# Patient Record
Sex: Female | Born: 1950 | Hispanic: No | Marital: Married | Smoking: Never smoker
Health system: Southern US, Community
[De-identification: ages and names within clinical notes are randomized; demographics above are authoritative.]

## PROBLEM LIST (undated history)

## (undated) DIAGNOSIS — K648 Other hemorrhoids: Secondary | ICD-10-CM

## (undated) DIAGNOSIS — K8020 Calculus of gallbladder without cholecystitis without obstruction: Secondary | ICD-10-CM

## (undated) DIAGNOSIS — E039 Hypothyroidism, unspecified: Secondary | ICD-10-CM

## (undated) DIAGNOSIS — D649 Anemia, unspecified: Secondary | ICD-10-CM

## (undated) DIAGNOSIS — Z85038 Personal history of other malignant neoplasm of large intestine: Secondary | ICD-10-CM

## (undated) DIAGNOSIS — K5790 Diverticulosis of intestine, part unspecified, without perforation or abscess without bleeding: Secondary | ICD-10-CM

## (undated) DIAGNOSIS — I10 Essential (primary) hypertension: Secondary | ICD-10-CM

## (undated) DIAGNOSIS — E119 Type 2 diabetes mellitus without complications: Secondary | ICD-10-CM

## (undated) DIAGNOSIS — K219 Gastro-esophageal reflux disease without esophagitis: Secondary | ICD-10-CM

## (undated) DIAGNOSIS — C189 Malignant neoplasm of colon, unspecified: Secondary | ICD-10-CM

## (undated) HISTORY — DX: Anemia, unspecified: D64.9

## (undated) HISTORY — PX: ILEOSTOMY CLOSURE: SHX1784

## (undated) HISTORY — DX: Diverticulosis of intestine, part unspecified, without perforation or abscess without bleeding: K57.90

## (undated) HISTORY — DX: Personal history of other malignant neoplasm of large intestine: Z85.038

## (undated) HISTORY — DX: Other hemorrhoids: K64.8

## (undated) HISTORY — DX: Calculus of gallbladder without cholecystitis without obstruction: K80.20

---

## 2003-05-29 HISTORY — PX: COLON RESECTION: SHX5231

## 2003-07-23 ENCOUNTER — Other Ambulatory Visit: Payer: Self-pay

## 2004-02-26 ENCOUNTER — Ambulatory Visit: Payer: Self-pay | Admitting: Oncology

## 2004-03-28 ENCOUNTER — Ambulatory Visit: Payer: Self-pay | Admitting: Oncology

## 2004-04-27 ENCOUNTER — Ambulatory Visit: Payer: Self-pay | Admitting: Oncology

## 2004-05-28 ENCOUNTER — Ambulatory Visit: Payer: Self-pay | Admitting: Oncology

## 2004-05-28 DIAGNOSIS — C189 Malignant neoplasm of colon, unspecified: Secondary | ICD-10-CM

## 2004-05-28 HISTORY — DX: Malignant neoplasm of colon, unspecified: C18.9

## 2004-06-28 ENCOUNTER — Ambulatory Visit: Payer: Self-pay | Admitting: Oncology

## 2004-08-10 ENCOUNTER — Ambulatory Visit: Payer: Self-pay | Admitting: Oncology

## 2004-08-26 ENCOUNTER — Ambulatory Visit: Payer: Self-pay | Admitting: Oncology

## 2004-10-02 ENCOUNTER — Ambulatory Visit: Payer: Self-pay | Admitting: Internal Medicine

## 2004-10-05 ENCOUNTER — Ambulatory Visit: Payer: Self-pay | Admitting: Oncology

## 2004-10-26 ENCOUNTER — Ambulatory Visit: Payer: Self-pay | Admitting: Oncology

## 2004-11-25 ENCOUNTER — Ambulatory Visit: Payer: Self-pay | Admitting: Oncology

## 2004-12-26 ENCOUNTER — Ambulatory Visit: Payer: Self-pay | Admitting: Oncology

## 2005-01-17 ENCOUNTER — Ambulatory Visit: Payer: Self-pay | Admitting: Internal Medicine

## 2005-01-26 ENCOUNTER — Ambulatory Visit: Payer: Self-pay | Admitting: Oncology

## 2005-01-31 ENCOUNTER — Ambulatory Visit: Payer: Self-pay | Admitting: Internal Medicine

## 2005-01-31 ENCOUNTER — Encounter (INDEPENDENT_AMBULATORY_CARE_PROVIDER_SITE_OTHER): Payer: Self-pay | Admitting: *Deleted

## 2005-04-25 ENCOUNTER — Ambulatory Visit: Payer: Self-pay | Admitting: Oncology

## 2005-04-27 ENCOUNTER — Ambulatory Visit: Payer: Self-pay | Admitting: Oncology

## 2005-05-04 ENCOUNTER — Ambulatory Visit: Payer: Self-pay | Admitting: Oncology

## 2005-07-11 ENCOUNTER — Ambulatory Visit: Payer: Self-pay | Admitting: Oncology

## 2005-07-26 ENCOUNTER — Ambulatory Visit: Payer: Self-pay | Admitting: Oncology

## 2005-08-26 ENCOUNTER — Ambulatory Visit: Payer: Self-pay | Admitting: Oncology

## 2005-10-18 ENCOUNTER — Ambulatory Visit: Payer: Self-pay | Admitting: Oncology

## 2005-10-20 ENCOUNTER — Ambulatory Visit: Payer: Self-pay | Admitting: Internal Medicine

## 2005-10-26 ENCOUNTER — Ambulatory Visit: Payer: Self-pay | Admitting: Oncology

## 2006-06-12 ENCOUNTER — Ambulatory Visit: Payer: Self-pay | Admitting: Oncology

## 2006-06-28 ENCOUNTER — Ambulatory Visit: Payer: Self-pay | Admitting: Oncology

## 2006-07-09 ENCOUNTER — Ambulatory Visit: Payer: Self-pay | Admitting: Internal Medicine

## 2006-07-17 ENCOUNTER — Encounter (INDEPENDENT_AMBULATORY_CARE_PROVIDER_SITE_OTHER): Payer: Self-pay | Admitting: Specialist

## 2006-07-17 ENCOUNTER — Ambulatory Visit: Payer: Self-pay | Admitting: Internal Medicine

## 2006-11-26 ENCOUNTER — Ambulatory Visit: Payer: Self-pay | Admitting: Oncology

## 2007-04-28 ENCOUNTER — Ambulatory Visit: Payer: Self-pay | Admitting: Internal Medicine

## 2007-05-01 ENCOUNTER — Ambulatory Visit: Payer: Self-pay | Admitting: Oncology

## 2007-05-29 ENCOUNTER — Ambulatory Visit: Payer: Self-pay | Admitting: Internal Medicine

## 2007-05-29 ENCOUNTER — Ambulatory Visit: Payer: Self-pay | Admitting: Oncology

## 2007-10-27 ENCOUNTER — Ambulatory Visit: Payer: Self-pay | Admitting: Internal Medicine

## 2007-12-27 ENCOUNTER — Ambulatory Visit: Payer: Self-pay | Admitting: Internal Medicine

## 2008-01-05 ENCOUNTER — Ambulatory Visit: Payer: Self-pay | Admitting: Internal Medicine

## 2008-01-27 ENCOUNTER — Ambulatory Visit: Payer: Self-pay | Admitting: Internal Medicine

## 2008-02-17 ENCOUNTER — Encounter: Payer: Self-pay | Admitting: Internal Medicine

## 2008-02-26 ENCOUNTER — Ambulatory Visit: Payer: Self-pay | Admitting: Internal Medicine

## 2008-02-27 ENCOUNTER — Ambulatory Visit: Payer: Self-pay | Admitting: Internal Medicine

## 2008-03-17 ENCOUNTER — Encounter: Payer: Self-pay | Admitting: Internal Medicine

## 2008-03-21 ENCOUNTER — Encounter: Payer: Self-pay | Admitting: Internal Medicine

## 2008-03-28 ENCOUNTER — Ambulatory Visit: Payer: Self-pay | Admitting: Internal Medicine

## 2008-03-29 ENCOUNTER — Encounter: Payer: Self-pay | Admitting: Internal Medicine

## 2008-03-30 DIAGNOSIS — Z862 Personal history of diseases of the blood and blood-forming organs and certain disorders involving the immune mechanism: Secondary | ICD-10-CM

## 2008-03-30 DIAGNOSIS — K5730 Diverticulosis of large intestine without perforation or abscess without bleeding: Secondary | ICD-10-CM | POA: Insufficient documentation

## 2008-03-30 DIAGNOSIS — K648 Other hemorrhoids: Secondary | ICD-10-CM | POA: Insufficient documentation

## 2008-03-30 DIAGNOSIS — Z85038 Personal history of other malignant neoplasm of large intestine: Secondary | ICD-10-CM | POA: Insufficient documentation

## 2008-04-05 ENCOUNTER — Ambulatory Visit: Payer: Self-pay | Admitting: Internal Medicine

## 2008-04-06 ENCOUNTER — Encounter: Payer: Self-pay | Admitting: Internal Medicine

## 2008-04-06 ENCOUNTER — Ambulatory Visit: Payer: Self-pay | Admitting: Internal Medicine

## 2008-04-08 ENCOUNTER — Encounter: Payer: Self-pay | Admitting: Internal Medicine

## 2008-09-25 ENCOUNTER — Ambulatory Visit: Payer: Self-pay | Admitting: Internal Medicine

## 2008-10-14 ENCOUNTER — Ambulatory Visit: Payer: Self-pay | Admitting: Internal Medicine

## 2008-10-19 ENCOUNTER — Ambulatory Visit: Payer: Self-pay | Admitting: Unknown Physician Specialty

## 2008-10-26 ENCOUNTER — Ambulatory Visit: Payer: Self-pay | Admitting: Internal Medicine

## 2009-04-27 ENCOUNTER — Ambulatory Visit: Payer: Self-pay | Admitting: Internal Medicine

## 2009-05-05 ENCOUNTER — Ambulatory Visit: Payer: Self-pay | Admitting: Internal Medicine

## 2009-05-28 ENCOUNTER — Ambulatory Visit: Payer: Self-pay | Admitting: Internal Medicine

## 2009-10-26 ENCOUNTER — Ambulatory Visit: Payer: Self-pay | Admitting: Internal Medicine

## 2009-11-11 ENCOUNTER — Ambulatory Visit: Payer: Self-pay | Admitting: Internal Medicine

## 2009-11-25 ENCOUNTER — Ambulatory Visit: Payer: Self-pay | Admitting: Internal Medicine

## 2010-02-16 ENCOUNTER — Encounter: Payer: Self-pay | Admitting: Internal Medicine

## 2010-03-08 ENCOUNTER — Ambulatory Visit: Payer: Self-pay | Admitting: Internal Medicine

## 2010-03-13 ENCOUNTER — Encounter (INDEPENDENT_AMBULATORY_CARE_PROVIDER_SITE_OTHER): Payer: Self-pay | Admitting: *Deleted

## 2010-03-31 ENCOUNTER — Encounter (INDEPENDENT_AMBULATORY_CARE_PROVIDER_SITE_OTHER): Payer: Self-pay | Admitting: *Deleted

## 2010-04-06 ENCOUNTER — Ambulatory Visit: Payer: Self-pay | Admitting: Internal Medicine

## 2010-04-28 ENCOUNTER — Telehealth (INDEPENDENT_AMBULATORY_CARE_PROVIDER_SITE_OTHER): Payer: Self-pay | Admitting: *Deleted

## 2010-05-10 ENCOUNTER — Ambulatory Visit: Payer: Self-pay | Admitting: Internal Medicine

## 2010-06-27 NOTE — Letter (Signed)
Summary: [facility] Instructions  [facility]  [address] [place], [place] [address]   Phone: [phone]  Fax: [phone]       Lorraine Lee    Jun 14, 1950    MRN: [id number]       Procedure Day /Date: Friday 04-28-10     Arrival Time:  7:30 a.m.     Procedure Time: 8:30 a.m.     Location of Procedure:                    _x _  [facility] (4th Floor)   PREPARATION FOR COLONOSCOPY WITH MIRALAX  Starting 5 days prior to your procedure  04-23-10  do not eat nuts, seeds, popcorn, corn, beans, peas,  salads, or any raw vegetables.  Do not take any fiber supplements (e.g. Metamucil, Citrucel, and Benefiber). ____________________________________________________________________________________________________   THE DAY BEFORE YOUR PROCEDURE         DATE:  04-27-10  DAY:  Thursday  1   Drink clear liquids the entire day-NO SOLID FOOD  2   Do not drink anything colored red or purple.  Avoid juices with pulp.  No orange juice.  3   Drink at least 64 oz. (8 glasses) of fluid/clear liquids during the day to prevent dehydration and help the prep work efficiently.  CLEAR LIQUIDS INCLUDE: Water Jello Ice Popsicles Tea (sugar ok, no milk/cream) Powdered fruit flavored drinks Coffee (sugar ok, no milk/cream) Gatorade Juice: apple, white grape, white cranberry  Lemonade Clear bullion, consomm, broth Carbonated beverages (any kind) Strained chicken noodle soup Hard Candy  4   Mix the entire bottle of Miralax with 64 oz. of Gatorade/Powerade in the morning and put in the refrigerator to chill.  5   At 3:00 pm take 2 Dulcolax/Bisacodyl tablets.  6   At 4:30 pm take one Reglan/Metoclopramide tablet.  7  Starting at 5:00 pm drink one 8 oz glass of the Miralax mixture every 15-20 minutes until you have finished drinking the entire 64 oz.  You should finish drinking prep around 7:30 or 8:00 pm.  8   If you are nauseated, you may take the 2nd  Reglan/Metoclopramide tablet at 6:30 pm.        9    At 8:00 pm take 2 more DULCOLAX/Bisacodyl tablets.     THE DAY OF YOUR PROCEDURE      DATE:   04-28-10 DAY:  Friday  You may drink clear liquids until  6:30 a.m.  (2 HOURS BEFORE PROCEDURE).   MEDICATION INSTRUCTIONS  Unless otherwise instructed, you should take regular prescription medications with a small sip of water as early as possible the morning of your procedure.         OTHER INSTRUCTIONS  You will need a responsible adult at least 60 years of age to accompany you and drive you home.   This person must remain in the waiting room during your procedure.  Wear loose fitting clothing that is easily removed.  Leave jewelry and other valuables at home.  However, you may wish to bring a book to read or an iPod/MP3 player to listen to music as you wait for your procedure to start.  Remove all body piercing jewelry and leave at home.  Total time from sign-in until discharge is approximately 2-3 hours.  You should go home directly after your procedure and rest.  You can resume normal activities the day after your procedure.  The day of your procedure you  should not:   Drive   Make legal decisions   Operate machinery   Drink alcohol   Return to work  You will receive specific instructions about eating, activities and medications before you leave.   The above instructions have been reviewed and explained to me by   Clide Cliff, RN______________________    I fully understand and can verbalize these instructions _____________________________ Date _______

## 2010-06-27 NOTE — Letter (Signed)
Summary: Pre Visit Letter Revised  [facility]  [address] [place], [place] [address]   Phone: [phone]  Fax: [phone]        03/13/2010 MRN: [id number] Lorraine Lee [address] [place], [place]  [address]             Procedure Date:  04/28/2010   Welcome to the Gastroenterology Division at [facility].    You are scheduled to see a nurse for your pre-procedure visit on 04/06/2010 at 1:00pm on the 3rd floor at [facility], [address]  We ask that you try to arrive at our office 15 minutes prior to your appointment time to allow for check-in.  Please take a minute to review the attached form.  If you answer "Yes" to one or more of the questions on the first page, we ask that you call the person listed at your earliest opportunity.  If you answer "No" to all of the questions, please complete the rest of the form and bring it to your appointment.    Your nurse visit will consist of discussing your medical and surgical history, your immediate family medical history, and your medications.   If you are unable to list all of your medications on the form, please bring the medication bottles to your appointment and we will list them.  We will need to be aware of both prescribed and over the counter drugs.  We will need to know exact dosage information as well.    Please be prepared to read and sign documents such as consent forms, a financial agreement, and acknowledgement forms.  If necessary, and with your consent, a friend or relative is welcome to sit-in on the nurse visit with you.  Please bring your insurance card so that we may make a copy of it.  If your insurance requires a referral to see a specialist, please bring your referral form from your primary care physician.  No co-pay is required for this nurse visit.     If you cannot keep your appointment, please call [phone] to cancel or reschedule prior to your appointment date.  This  allows [place] the opportunity to schedule an appointment for another patient in need of care.    Thank you for choosing [facility] for your medical needs.  We appreciate the opportunity to care for you.  Please visit [place] at our website  to learn more about our practice.  Sincerely, The Gastroenterology Division

## 2010-06-27 NOTE — Letter (Signed)
Summary: Colonoscopy Letter  [facility]  [address] [place], [place] [address]   Phone: [phone]  Fax: [phone]      February 16, 2010 MRN: [id number]   Lorraine Lee [address] [place], [place]  [address]   Dear Ms. Christiano,   According to your medical record, it is time for you to schedule a Colonoscopy. The American Cancer Society recommends this procedure as a method to detect early colon cancer. Patients with a family history of colon cancer, or a personal history of colon polyps or inflammatory bowel disease are at increased risk.  This letter has beeen generated based on the recommendations made at the time of your procedure. If you feel that in your particular situation this may no longer apply, please contact our office.  Please call our office at [phone] to schedule this appointment or to update your records at your earliest convenience.  Thank you for cooperating with [place] to provide you with the very best care possible.   Sincerely,  Hedwig Morton. Juanda Chance, M.D.  [facility] Gastroenterology Division [phone]

## 2010-06-27 NOTE — Miscellaneous (Signed)
Summary: REC COL...AS.  Clinical Lists Changes  Medications: Added new medication of MIRALAX   POWD (POLYETHYLENE GLYCOL 3350) As directed - Signed Added new medication of REGLAN 10 MG  TABS (METOCLOPRAMIDE HCL) As directed - Signed Added new medication of DULCOLAX 10 MG  SUPP (BISACODYL) As directed - Signed Rx of MIRALAX   POWD (POLYETHYLENE GLYCOL 3350) As directed;  #255 gms x 0;  Signed;  Entered by: Clide Cliff RN;  Authorized by: Hart Carwin MD;  Method used: [facility] to [facility] [phone]*, [address] [place], [place], [place]  [address], Ph: [phone] or [phone], Fax: [phone] Rx of REGLAN 10 MG  TABS (METOCLOPRAMIDE HCL) As directed;  #2 x 0;  Signed;  Entered by: Clide Cliff RN;  Authorized by: Hart Carwin MD;  Method used: Electronically to [facility]. [phone]*, [address], [place], [place], [place]  [address], Ph: [phone] or [phone], Fax: [phone] Rx of DULCOLAX 10 MG  SUPP (BISACODYL) As directed;  #4 x 0;  Signed;  Entered by: Clide Cliff RN;  Authorized by: Hart Carwin MD;  Method used: Electronically to [facility]. [phone]*, [address] [place], [place], [place]  [address], Ph: [phone] or [phone], Fax: [phone] Allergies: Changed allergy or adverse reaction from SULFA to SULFA Changed allergy or adverse reaction from * TETANUS to * TETANUS    Prescriptions: DULCOLAX 10 MG  SUPP (BISACODYL) As directed  #4 x 0   Entered by:   Clide Cliff RN   Authorized by:   Hart Carwin MD   Signed by:   Clide Cliff RN on 04/06/2010   Method used:   Electronically to        [facility] [phone]* (retail)       [address]       [place], [place]  [address]       Ph: [phone] or [phone]       Fax: [phone]   RxID:   [id number] REGLAN 10 MG  TABS (METOCLOPRAMIDE HCL) As directed  #2 x 0   Entered by:   Clide Cliff RN   Authorized by:   Hart Carwin MD  Signed by:   Clide Cliff RN on 04/06/2010   Method used:   Electronically to        [facility]. [phone]* (retail)       [address] [place], [place]  [address]       Ph: [phone] or [phone]       Fax: [phone]   RxID:   [id number] MIRALAX   POWD (POLYETHYLENE GLYCOL 3350) As directed  #255 gms x 0   Entered by:   Clide Cliff RN   Authorized by:   Hart Carwin MD   Signed by:   Clide Cliff RN on 04/06/2010   Method used:   Electronically to        [facility]. [phone]* (retail)       [address] [place], [place]  [address]       Ph: [phone] or [phone]       Fax: [phone]   RxID:   [id number]

## 2010-06-29 NOTE — Progress Notes (Signed)
Summary: Cancellation  Phone Note Outgoing Call   Call placed by: Haynes Bast Call placed to: Patient Summary of Call: French Ana called pt concerning todays COL appt, per husband pt did not do prep because she was sick and because she had just gotten in from traveling yesterday.   Per Dr. Juanda Chance please charge cancellation fee and call pt to reschedule the appt.  Follow-up for Phone Call        [employer], employee of pt, called to schedule an appt for pt for constipation... nothing is available  [phone] Follow-up by: Vallarie Mare,  May 03, 2010 3:45 PM    Additional Follow-up for Phone Call Additional follow up Details #2::    Returned patient's call. Spoke with patient. She wants to reschedule her COLON. Rescheduled for 05/10/10 at 9 AM with 8 AM arrival. Patient has her instructions from the colon that she failed to keep and will follow them. Follow-up by: Jesse Fall RN,  May 03, 2010 4:43 PM   Appended Document: Cancellation Patient BILLED.

## 2010-06-29 NOTE — Procedures (Signed)
Summary: Colonoscopy  Patient: Lorraine Lee Note: All result statuses are Final unless otherwise noted.  Tests: (1) Colonoscopy (COL)   COL Colonoscopy           DONE     [facility]     [address]     [place], [place]  [address]           COLONOSCOPY PROCEDURE REPORT           PATIENT:  Lorraine, Lee  MR#:  [id number]     BIRTHDATE:  04-25-1951, 59 yrs. old  GENDER:  female     ENDOSCOPIST:  Hedwig Morton. Juanda Chance, MD     REF. BY:  Lia Foyer, M.D.     PROCEDURE DATE:  05/10/2010     PROCEDURE:  Colonoscopy 528[address]     ASA CLASS:  Class II     INDICATIONS:  history of colon cancer, constipation, Abdominal     pain T3N0 colon cancer rectosigmoid in 2005,,s/p cemoRx and a 2     stage resection at [facility],     last colon 03/2008,     MEDICATIONS:   Versed 5 mg, Fentanyl 50 mcg           DESCRIPTION OF PROCEDURE:   After the risks benefits and     alternatives of the procedure were thoroughly explained, informed     consent was obtained.  Digital rectal exam was performed and     revealed no rectal masses.   The LB [id number] endoscope     was introduced through the anus and advanced to the cecum, which     was identified by both the appendix and ileocecal valve, without     limitations.  The quality of the prep was good, using MiraLax.     The instrument was then slowly withdrawn as the colon was fully     examined.     <<PROCEDUREIMAGES>>           FINDINGS:  There was evidence of a prior segmental colectomy (see     image7 and image6). sigmoid anastomosis at 12 cm, wide open,     normal granulation tissue,  Mild diverticulosis was found (see     image2 and image1).  Otherwise normal colonoscopy without other     polyps, masses, vascular ectasias, or inflammatory changes (see     image3 and image4).  Internal hemorrhoids were found (see image8).     Retroflexed views in the rectum revealed no abnormalities.    The     scope was then withdrawn from the patient  and the procedure     completed.           COMPLICATIONS:  None     ENDOSCOPIC IMPRESSION:     1) Prior segmental colectomy     2) Mild diverticulosis     3) Otherwise nl colonoscopy WMO     4) Internal hemorrhoids     no evidence for recurrent carcinoma (2005)     RECOMMENDATIONS:     1) high fiber diet     Trial of Probiotics, prunes, stool softener prn constipation     REPEAT EXAM:  In 3 year(s) for.           ______________________________     Hedwig Morton. Juanda Chance, MD           CC:           n.     eSIGNED:  Hedwig Morton. Brodie at 05/10/2010 09:37 AM           Sheldon Silvan, [id number]  Note: An exclamation mark (!) indicates a result that was not dispersed into the flowsheet. Document Creation Date: 05/10/2010 9:38 AM _______________________________________________________________________  (1) Order result status: Final Collection or observation date-time: 05/10/2010 09:24 Requested date-time:  Receipt date-time:  Reported date-time:  Referring Physician:   Ordering Physician: Lina Sar [phone]) Specimen Source:  Source: Launa Grill Order Number: [id number] Lab site:   Appended Document: Colonoscopy    Clinical Lists Changes  Observations: Added new observation of COLONNXTDUE: 04/2013 (05/10/2010 11:01)

## 2010-10-13 NOTE — Assessment & Plan Note (Signed)
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  She had a resection at [facility] by  Dr. Luciano Cutter, and has been followed in [place].  The anterior lower  resection and colorectal anastomosis was done on October 28, 2003.  She has  done quite well, but had developed one episode of rectal bleeding.  Her  last colonoscopy in September 2006 showed a well-healed anastomosis,  mild diverticulosis of the left colon.  She denies currently any ongoing  rectal bleeding or abdominal pain.  She has been somewhat constipated  and having poor evacuation, also some urinary stress incontinence  suggestive of pelvic relaxation.  She initially had a colostomy for  diversion and the colostomy was taken down in January 2006.   PHYSICAL EXAMINATION:  Blood pressure 102/78, pulse 64 and weight 202  pounds.  She was alert, oriented, in no distress.  ABDOMEN:  Soft, nontender, with normoactive bowel sounds.  On anoscopic and rectal exam there was normal perianal area, rectal tone  was normal.  She had small first grade hemorrhoids internally, normal  appearing rectal ampulla stools, Hemoccult-negative.   IMPRESSION:  A 60 year old female, status post sigmoid resection of  rectal carcinoma, status post chemotherapy.  She had a T3 N0 tumor.  I  believe her rectal bleeding was related to small hemorrhoids, but she is  quite anxious about it and therefore we will proceed with colonoscopy  which was initially scheduled for September 2008, but because of the  rectal bleeding we will proceed with colonoscopy now.  She also will  need a CEA level and a yearly CT scan  of the pelvis.  I will have to  find out whether Dr. Deland Pretty will be scheduling the CT scan or if our  office should participate in that followup.  As far as her poor  evacuation is concerned, I suggested over-the-counter stool softeners.  We also instructed her on a high-fiber diet to improve her transit time.     Hedwig Morton. Juanda Chance, MD  Electronically Signed    DMB/MedQ  DD: 07/09/2006  DT: 07/10/2006  Job #: [id number]

## 2011-08-14 ENCOUNTER — Ambulatory Visit: Payer: Self-pay | Admitting: Oncology

## 2011-08-27 ENCOUNTER — Ambulatory Visit: Payer: Self-pay | Admitting: Oncology

## 2012-07-26 ENCOUNTER — Ambulatory Visit: Payer: Self-pay | Admitting: Oncology

## 2012-08-26 ENCOUNTER — Ambulatory Visit: Payer: Self-pay | Admitting: Oncology

## 2012-11-07 ENCOUNTER — Ambulatory Visit: Payer: Self-pay | Admitting: Oncology

## 2012-11-25 ENCOUNTER — Ambulatory Visit: Payer: Self-pay | Admitting: Oncology

## 2013-02-18 ENCOUNTER — Encounter: Payer: Self-pay | Admitting: Internal Medicine

## 2013-03-31 ENCOUNTER — Encounter: Payer: Self-pay | Admitting: Internal Medicine

## 2013-04-17 ENCOUNTER — Ambulatory Visit: Payer: Self-pay | Admitting: Hematology and Oncology

## 2013-04-27 ENCOUNTER — Ambulatory Visit: Payer: Self-pay | Admitting: Hematology and Oncology

## 2013-05-06 ENCOUNTER — Telehealth: Payer: Self-pay | Admitting: Internal Medicine

## 2013-05-06 NOTE — Telephone Encounter (Signed)
Left a message for patient to call me. 

## 2013-05-07 NOTE — Telephone Encounter (Signed)
Unable to reach patient will try again later. Loud ringing noise like a fax machine when called.

## 2013-05-08 ENCOUNTER — Telehealth: Payer: Self-pay | Admitting: *Deleted

## 2013-05-08 ENCOUNTER — Encounter: Payer: Self-pay | Admitting: Internal Medicine

## 2013-05-08 ENCOUNTER — Ambulatory Visit (INDEPENDENT_AMBULATORY_CARE_PROVIDER_SITE_OTHER): Payer: 59 | Admitting: Internal Medicine

## 2013-05-08 VITALS — BP 108/72 | HR 76 | Ht 65.0 in | Wt 207.2 lb

## 2013-05-08 DIAGNOSIS — Z85038 Personal history of other malignant neoplasm of large intestine: Secondary | ICD-10-CM

## 2013-05-08 DIAGNOSIS — R112 Nausea with vomiting, unspecified: Secondary | ICD-10-CM

## 2013-05-08 DIAGNOSIS — R109 Unspecified abdominal pain: Secondary | ICD-10-CM

## 2013-05-08 MED ORDER — OMEPRAZOLE 40 MG PO CPDR: 40.0000 mg | DELAYED_RELEASE_CAPSULE | Freq: Every day | ORAL | Status: DC

## 2013-05-08 MED ORDER — MOVIPREP 100 G PO SOLR: 1.0000 | Freq: Once | ORAL | Status: DC

## 2013-05-08 NOTE — Patient Instructions (Signed)
You have been scheduled for an endoscopy and colonoscopy with propofol. Please follow the written instructions given to you at your visit today. Please pick up your prep at the pharmacy within the next 1-3 days. If you use inhalers (even only as needed), please bring them with you on the day of your procedure. Your physician has requested that you go to www.startemmi.com and enter the access code given to you at your visit today. This web site gives a general overview about your procedure. However, you should still follow specific instructions given to you by our office regarding your preparation for the procedure.  We have sent the following medications to your pharmacy for you to pick up at your convenience: Prilosec 40 mg daily  Your physician has requested that you go to the basement for the following lab work before leaving today: CEA, CMET, CBC, IBC  You have been scheduled for an abdominal ultrasound at [facility] (1st floor of hospital) on _______ at ___________. Please arrive 15 minutes prior to your appointment for registration. Make certain not to have anything to eat or drink 6 hours prior to your appointment. Should you need to reschedule your appointment, please contact radiology at [phone]. This test typically takes about 30 minutes to perform.  CC: Dr Corky Downs

## 2013-05-08 NOTE — Progress Notes (Signed)
Lorraine Lee [birth year] [id number]   History of Present Illness:  62 year old female wife of Dr.Minervini, has been experiencing abdominal pain, indigestion,reflux symptoms and several episodes of vomiting while visiting [place]  for Thanksgiving. She has been having 3-4 bowel movements a day. She denies rectal bleeding or weight loss. 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 Resected at [facility] with 2-stage resection by Dr Luciano Cutter. She had a takedown of colostomy in January 2006. Last colonoscopy was in December 2011 and showed patent anastomosis at 12 cm and  mild diverticulosis of the sigmoid colon. Her last CT scan of the abdomen was in October 2009. There is a positive family history of colon cancer in maternal uncle. She has been followed by an oncologist and [place] , She had blood test within last 2 weeks but the don't have the results  Past Medical History  Diagnosis Date  . Diverticulosis   . Anemia   . Internal hemorrhoids without mention of complication   . Personal history of malignant neoplasm of large intestine     Past Surgical History  Procedure Laterality Date  . Colon resection    . Cesarean section      x4  . Ileostomy closure      Allergies  Allergen Reactions  . Sulfonamide Derivatives     REACTION: rash  . Tetanus Toxoid     REACTION: fever and swelling of site    Review of Systems:   The remainder of the 10 point ROS is negative except as outlined in the H&P  Physical Exam: General Appearance Well developed, in no distress Eyes  Non icteric  HEENT  Non traumatic, normocephalic  Mouth No lesion, tongue papillated, no cheilosis Neck Supple without adenopathy, thyroid not enlarged, no carotid bruits, no JVD Lungs Clear to auscultation bilaterally COR Normal S1, normal S2, regular rhythm, no murmur, quiet precordium Abdomen Soft,  mildly tender along the right costal margin  with normoactive bowel  soundsWell-healed surgical scars. No distention. No palpable mass  Rectal Soft Hemoccult negative stool  Extremities  No pedal edema Skin No lesions Neurological Alert and oriented x 3 Psychological Normal mood and affect  Assessment and Plan:   62 year old female 9 years since 2-stage resection of IIA rectosigmoid carcinoma who had a normal colonoscopy in a December 2011 and is due for recall colonoscopy at this time. She is Hemoccult-negative.Marland Kitchen Her blood tests have been normal in [facility] office  but we will check with her doctor 's office  Nausea vomiting and dyspepsia. We will evaluate her for gallbladder disease because of right upper quadrant discomfort. We will obtain upper abdominal ultrasound. We will start Prilosec at 40 mg daily for gastroesophageal reflux and schedule for upper endoscopy the same day as the colonoscopy. Depending on the results of the sono and EGD she may also need a CT scan  if the CEA is elevated    Lina Sar 05/08/2013

## 2013-05-08 NOTE — Telephone Encounter (Signed)
Spoke with patient and she has had nausea when she eats with some vomiting. Also states she is having lower abdominal pain. Denies constipation and diarrhea. Scheduled with Dr. Brodie today at 3:30 PM  

## 2013-05-08 NOTE — Telephone Encounter (Signed)
Spoke with patient and she has had nausea when she eats with some vomiting. Also states she is having lower abdominal pain. Denies constipation and diarrhea. Scheduled with Dr. Juanda Chance today at 3:30 PM

## 2013-05-12 ENCOUNTER — Telehealth: Payer: Self-pay | Admitting: Internal Medicine

## 2013-05-12 ENCOUNTER — Other Ambulatory Visit: Payer: Self-pay | Admitting: *Deleted

## 2013-05-12 DIAGNOSIS — R112 Nausea with vomiting, unspecified: Secondary | ICD-10-CM

## 2013-05-12 DIAGNOSIS — R1013 Epigastric pain: Secondary | ICD-10-CM

## 2013-05-12 NOTE — Telephone Encounter (Signed)
Spoke with patient and gave her the appointment date, time and instructions.

## 2013-05-12 NOTE — Telephone Encounter (Signed)
Spoke with radiology scheduling at [facility] and scheduled [place] on 05/13/13 at 8:45 AM. NPO after midnight. [place] location. [address] Left a message for patient to call me.

## 2013-05-13 ENCOUNTER — Ambulatory Visit (HOSPITAL_COMMUNITY): Admission: RE | Admit: 2013-05-13 | Payer: 59 | Source: Ambulatory Visit

## 2013-05-13 ENCOUNTER — Ambulatory Visit: Payer: Self-pay | Admitting: Internal Medicine

## 2013-05-15 ENCOUNTER — Other Ambulatory Visit: Payer: Self-pay | Admitting: Internal Medicine

## 2013-05-18 ENCOUNTER — Telehealth: Payer: Self-pay | Admitting: *Deleted

## 2013-05-18 ENCOUNTER — Other Ambulatory Visit: Payer: Self-pay | Admitting: *Deleted

## 2013-05-18 DIAGNOSIS — K769 Liver disease, unspecified: Secondary | ICD-10-CM

## 2013-05-18 NOTE — Telephone Encounter (Signed)
Message copied by Daphine Deutscher on Mon May 18, 2013  8:20 AM ------      Message from: Hart Carwin      Created: Fri May 15, 2013  5:58 PM      Regarding: MRI of the liver       Rene Kocher, I have spoken to Dr Juel Burrow and he wants to go ahead and schedule MRI of the liver to assess liver lesion on ultrasound. Please try to schedule it for Mon or Tuesday because she is having colonoscopy on Wed. He also wants her to add EGD to colon because of reflux and indigestion. I will be in [facility] on Monday. Let me know if any problems. ------

## 2013-05-18 NOTE — Telephone Encounter (Signed)
Spoke with [facility] radiology and scheduled MRI of abdomen on 05/19/13 at 11:15/11:45 AM. No prep. Spoke with patient's husband and gave him appointment date and time.

## 2013-05-19 ENCOUNTER — Other Ambulatory Visit: Payer: Self-pay | Admitting: Internal Medicine

## 2013-05-19 ENCOUNTER — Ambulatory Visit: Payer: Self-pay | Admitting: Internal Medicine

## 2013-05-19 DIAGNOSIS — K769 Liver disease, unspecified: Secondary | ICD-10-CM

## 2013-05-20 ENCOUNTER — Ambulatory Visit (AMBULATORY_SURGERY_CENTER): Payer: 59 | Admitting: Internal Medicine

## 2013-05-20 ENCOUNTER — Encounter: Payer: Self-pay | Admitting: Internal Medicine

## 2013-05-20 VITALS — BP 132/74 | HR 68 | Temp 96.8°F | Resp 17 | Ht 65.0 in | Wt 207.0 lb

## 2013-05-20 DIAGNOSIS — R109 Unspecified abdominal pain: Secondary | ICD-10-CM

## 2013-05-20 DIAGNOSIS — K3189 Other diseases of stomach and duodenum: Secondary | ICD-10-CM

## 2013-05-20 DIAGNOSIS — Z85038 Personal history of other malignant neoplasm of large intestine: Secondary | ICD-10-CM

## 2013-05-20 MED ORDER — SODIUM CHLORIDE 0.9 % IV SOLN: 500.0000 mL | INTRAVENOUS | Status: DC

## 2013-05-20 NOTE — Patient Instructions (Signed)

## 2013-05-20 NOTE — Progress Notes (Signed)
A/ox3 pleased with MAC, report to Kristin RN 

## 2013-05-20 NOTE — Op Note (Signed)
[facility] [address] [place], [address]   ENDOSCOPY PROCEDURE REPORT  PATIENT: Lorraine, Lee  MR#: [id number] BIRTHDATE: 1951/04/03 , 62  yrs. old GENDER: Female ENDOSCOPIST: Hart Carwin, MD REFERRED BY:  Corky Downs, M.D. PROCEDURE DATE:  05/20/2013 PROCEDURE:  EGD, diagnostic ASA CLASS:     Class II INDICATIONS:  Dyspepsia.   some episode of vomiting.  Cholelithiasis on ultrasound, history of colon cancer resected in 2005. MEDICATIONS: MAC sedation, administered by CRNA and propofol (Diprivan) 150mg  IV TOPICAL ANESTHETIC: none  DESCRIPTION OF PROCEDURE: After the risks benefits and alternatives of the procedure were thoroughly explained, informed consent was obtained.  The LB [id number] endoscope was introduced through the mouth and advanced to the second portion of the duodenum. Without limitations.  The instrument was slowly withdrawn as the mucosa was fully examined.      [Esophagus: esophageal mucosa was normal in the proximal mid and distal esophagus. Squamocolumnar junction was unremarkable. There was no significant hiatal hernia Stomach: Gastric folds were normal. Gastric antrum and pyloric outlet were unremarkable. Retroflexion of the endoscope revealed normal fundus and cardiaDuodenum: Duodenal bulb and descending duodenum was normal         The scope was then withdrawn from the patient and the procedure completed.  COMPLICATIONS: There were no complications. ENDOSCOPIC IMPRESSION: normal upper endoscopy of the esophagus stomach and duodenum. Nothing to account for dyspepsia, suspect is traumatic cholelithiasis versus irritable bowel syndrome RECOMMENDATIONS: 1.  Proceed with colonoscopy Consider a HIDA scan Continue acid reducing indications when necessary dyspepsia 2.  Proceed with colonoscopy Consider a HIDA scan Continue acid reducing indications when necessary dyspepsia  REPEAT EXAM: no  eSigned:  Hart Carwin,  MD 05/20/2013 11:24 AM   CC:  PATIENT NAME:  Lorraine, Lee MR#: [id number]

## 2013-05-20 NOTE — Op Note (Signed)
[facility] [address] [place], [address]   COLONOSCOPY PROCEDURE REPORT  PATIENT: Lorraine Lee, Lorraine Lee  MR#: [id number] BIRTHDATE: 11-20-1950 , 62  yrs. old GENDER: Female ENDOSCOPIST: Hart Carwin, MD REFERRED AO:ZHYQM Bulnes, M.D. PROCEDURE DATE:  05/20/2013 PROCEDURE:   Colonoscopy, screening First Screening Colonoscopy - Avg.  risk and is 50 yrs.  old or older - No.  Prior Negative Screening - Now for repeat screening. N/A  History of Adenoma - Now for follow-up colonoscopy & has been > or = to 3 yrs.  N/A  Polyps Removed Today? No.  Recommend repeat exam, <10 yrs? Yes.  High risk (family or personal hx). 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  Ostomy taken down 2006, Last colonoscopy December 2011 showed a widely patent anastomosis and mild diverticulosis., hemorrhoids MEDICATIONS: MAC sedation, administered by CRNA and propofol (Diprivan) 150mg  IV  DESCRIPTION OF PROCEDURE:   After the risks benefits and alternatives of the procedure were thoroughly explained, informed consent was obtained.  A digital rectal exam revealed no abnormalities of the rectum.   The LB [id number]  endoscope was introduced through the anus and advanced to the cecum, which was identified by both the appendix and ileocecal valve. No adverse events experienced.   The quality of the prep was excellent, using MoviPrep  The instrument was then slowly withdrawn as the colon was fully examined.      COLON FINDINGS: There was evidence of a prior end-to-end colo-colonic surgical anastomosis in the sigmoid colon.at the level of 12 cm from the anal canal. Anastomosis was widely patent. There were scattered diverticula proximal to the anastomosis  Retroflexed views revealed no abnormalities.there was small first-grade internal hemorrhoids The time to cecum=6 minutes 01  seconds. Withdrawal time=7 minutes 10 seconds.  The scope was withdrawn and the procedure completed. COMPLICATIONS: There were no complications.  ENDOSCOPIC IMPRESSION: 1There was evidence of a prior colo-colonic surgical anastomosis in the sigmoid colon 2. no evidence for recurrent cancer 3. Mild sigmoid diverticulosis 4 small internal hemorrhoids  RECOMMENDATIONS: high-fiber diet Recall colonoscopy in 5 years   eSigned:  Hart Carwin, MD 05/20/2013 11:32 AM   cc:   PATIENT NAME:  Lorraine Lee, Lorraine Lee MR#: [id number]

## 2013-05-25 ENCOUNTER — Telehealth: Payer: Self-pay | Admitting: *Deleted

## 2013-05-25 NOTE — Telephone Encounter (Signed)
No answer, message left for the patient. 

## 2013-05-26 ENCOUNTER — Ambulatory Visit (HOSPITAL_COMMUNITY): Payer: 59

## 2013-05-28 HISTORY — PX: COLONOSCOPY WITH ESOPHAGOGASTRODUODENOSCOPY (EGD): SHX5779

## 2013-06-12 ENCOUNTER — Encounter: Payer: Self-pay | Admitting: Internal Medicine

## 2013-07-02 ENCOUNTER — Encounter: Payer: Self-pay | Admitting: Internal Medicine

## 2013-12-23 ENCOUNTER — Ambulatory Visit: Payer: Self-pay | Admitting: Hematology and Oncology

## 2014-01-15 ENCOUNTER — Ambulatory Visit: Payer: Self-pay | Admitting: Endocrinology

## 2014-02-23 ENCOUNTER — Ambulatory Visit: Payer: Self-pay | Admitting: Endocrinology

## 2014-02-25 ENCOUNTER — Other Ambulatory Visit: Payer: Self-pay | Admitting: Orthopedic Surgery

## 2014-03-12 ENCOUNTER — Encounter (HOSPITAL_BASED_OUTPATIENT_CLINIC_OR_DEPARTMENT_OTHER): Payer: Self-pay | Admitting: *Deleted

## 2014-03-12 NOTE — Progress Notes (Signed)
New diabetic-will need [id number]-

## 2014-03-16 NOTE — H&P (Signed)
Lorraine Lee is an 63 y.o. female.   Chief Complaint: Right knee pain  HPI: Lorraine Lee presents to our clinic today complaining of right knee pain.  This pain began about 2 months ago after descending 5 flights of stairs in [place].  At that time both lower extremities were extremely swollen; however, this has since resolved.  At that time, she sought medical attention and was prescribed physical therapy and cortisone injections.  The therapy did not alleviate her pain, nor did the 3 cortisone injections.  She states that the cortisone injections only relieved her pain for a couple of hours.  She states the pain is constant when moving and rates at about a 5/10.  It appears to be getting worse, but it does not wake her from sleep.  She continues to wear a brace, but is not sure if it's helping.  She has had previous x-rays, and an MRI.  MRI shows a radial tear through the root of the posterior horn of the medial meniscus.  She was also diagnosed with a Baker's cyst.  She has been prescribed Mobic; however, it has recently been replaced with Indocin.  She has also been prescribed Voltaren gel.  She does have a history of knee problems, but significant for left knee.  Past Medical History  Diagnosis Date  . Diverticulosis   . Anemia   . Internal hemorrhoids without mention of complication   . Personal history of malignant neoplasm of large intestine   . Diabetes mellitus without complication   . GERD (gastroesophageal reflux disease)     Past Surgical History  Procedure Laterality Date  . Colon resection  2005  . Cesarean section      x4  . Ileostomy closure    . Colonoscopy with esophagogastroduodenoscopy (egd)      Family History  Problem Relation Age of Onset  . Colon cancer Maternal Uncle   . Diabetes Father   . Heart disease Father    Social History:  reports that she has never smoked. She has never used smokeless tobacco. She reports that she does not drink alcohol or use illicit  drugs.  Allergies:  Allergies  Allergen Reactions  . Sulfonamide Derivatives     REACTION: rash  . Tetanus Toxoid     REACTION: fever and swelling of site    No prescriptions prior to admission    No results found for this or any previous visit (from the past 48 hour(s)). No results found.  Review of Systems  Constitutional: Negative.   HENT: Negative.   Eyes: Negative.   Respiratory: Negative.   Cardiovascular: Positive for leg swelling.  Gastrointestinal: Negative.   Genitourinary: Negative.   Musculoskeletal: Positive for joint pain and myalgias.  Skin: Negative.   Neurological: Negative.   Psychiatric/Behavioral: Negative.     Height 5\' 5"  (1.651 m), weight 92.987 kg (205 lb). Physical Exam  Constitutional: She is oriented to person, place, and time. She appears well-developed and well-nourished.  HENT:  Head: Normocephalic and atraumatic.  Eyes: Pupils are equal, round, and reactive to light.  Neck: Normal range of motion. Neck supple.  Cardiovascular: Intact distal pulses.   Respiratory: Effort normal.  Musculoskeletal: She exhibits tenderness.  Tender to palpation along the medial joint line.  Mild tenderness in the popliteal fossa.  No tenderness along the lateral joint line, quadriceps or patellar tendons.  No effusion present.  Range of motion of right knee: 0-130.  Strength 5/5.  No ligamentous laxity.  Neurological: She is alert and oriented to person, place, and time.  Skin: Skin is warm and dry.  Psychiatric: She has a normal mood and affect. Her behavior is normal. Judgment and thought content normal.     Assessment/Plan Impression: 1. Medial meniscal tear of right knee 2. Chondromalacia of right knee    Plan: MRI findings were discussed with the patient and she has elected to have a right knee arthroscopy.  The procedure was discussed and all questions were answered.  If this procedure does not improve her pain she will consider synthetic joint  fluid injections.  She is encouraged to do low impact exercises such as biking, walking, and swimming.  Lorraine Lee R 03/16/2014, 12:36 PM

## 2014-03-17 ENCOUNTER — Encounter (HOSPITAL_BASED_OUTPATIENT_CLINIC_OR_DEPARTMENT_OTHER): Payer: 59 | Admitting: Certified Registered"

## 2014-03-17 ENCOUNTER — Encounter (HOSPITAL_BASED_OUTPATIENT_CLINIC_OR_DEPARTMENT_OTHER)
Admission: RE | Disposition: A | Discharge status: Home / Self Care | Payer: Self-pay | Source: Ambulatory Visit | Attending: Orthopedic Surgery

## 2014-03-17 ENCOUNTER — Ambulatory Visit (HOSPITAL_BASED_OUTPATIENT_CLINIC_OR_DEPARTMENT_OTHER)
Admission: RE | Admit: 2014-03-17 | Discharge: 2014-03-17 | Disposition: A | Discharge status: Home / Self Care | Payer: 59 | Source: Ambulatory Visit | Attending: Orthopedic Surgery | Admitting: Orthopedic Surgery

## 2014-03-17 ENCOUNTER — Ambulatory Visit (HOSPITAL_BASED_OUTPATIENT_CLINIC_OR_DEPARTMENT_OTHER): Payer: 59 | Admitting: Certified Registered"

## 2014-03-17 ENCOUNTER — Encounter (HOSPITAL_BASED_OUTPATIENT_CLINIC_OR_DEPARTMENT_OTHER): Payer: Self-pay

## 2014-03-17 DIAGNOSIS — M94261 Chondromalacia, right knee: Secondary | ICD-10-CM | POA: Diagnosis not present

## 2014-03-17 DIAGNOSIS — S83241A Other tear of medial meniscus, current injury, right knee, initial encounter: Secondary | ICD-10-CM | POA: Diagnosis present

## 2014-03-17 DIAGNOSIS — Y9339 Activity, other involving climbing, rappelling and jumping off: Secondary | ICD-10-CM | POA: Diagnosis not present

## 2014-03-17 DIAGNOSIS — Z85038 Personal history of other malignant neoplasm of large intestine: Secondary | ICD-10-CM | POA: Diagnosis not present

## 2014-03-17 DIAGNOSIS — Y998 Other external cause status: Secondary | ICD-10-CM | POA: Diagnosis not present

## 2014-03-17 DIAGNOSIS — S83206A Unspecified tear of unspecified meniscus, current injury, right knee, initial encounter: Secondary | ICD-10-CM

## 2014-03-17 DIAGNOSIS — Z887 Allergy status to serum and vaccine status: Secondary | ICD-10-CM | POA: Insufficient documentation

## 2014-03-17 DIAGNOSIS — Z882 Allergy status to sulfonamides status: Secondary | ICD-10-CM | POA: Diagnosis not present

## 2014-03-17 DIAGNOSIS — E119 Type 2 diabetes mellitus without complications: Secondary | ICD-10-CM | POA: Diagnosis not present

## 2014-03-17 DIAGNOSIS — K219 Gastro-esophageal reflux disease without esophagitis: Secondary | ICD-10-CM | POA: Diagnosis not present

## 2014-03-17 DIAGNOSIS — Y9289 Other specified places as the place of occurrence of the external cause: Secondary | ICD-10-CM | POA: Diagnosis not present

## 2014-03-17 HISTORY — DX: Gastro-esophageal reflux disease without esophagitis: K21.9

## 2014-03-17 HISTORY — DX: Type 2 diabetes mellitus without complications: E11.9

## 2014-03-17 HISTORY — PX: KNEE ARTHROSCOPY WITH MEDIAL MENISECTOMY: SHX5651

## 2014-03-17 LAB — POCT I-STAT, CHEM 8
BUN: 18 mg/dL (ref 6–23)
CALCIUM ION: 1.14 mmol/L (ref 1.13–1.30)
Chloride: 110 mEq/L (ref 96–112)
Creatinine, Ser: 0.8 mg/dL (ref 0.50–1.10)
GLUCOSE: 118 mg/dL — AB (ref 70–99)
HEMATOCRIT: 39 % (ref 36.0–46.0)
Hemoglobin: 13.3 g/dL (ref 12.0–15.0)
Potassium: 4.2 mEq/L (ref 3.7–5.3)
Sodium: 140 mEq/L (ref 137–147)
TCO2: 26 mmol/L (ref 0–100)

## 2014-03-17 LAB — GLUCOSE, CAPILLARY: Glucose-Capillary: 91 mg/dL (ref 70–99)

## 2014-03-17 SURGERY — ARTHROSCOPY, KNEE, WITH MEDIAL MENISCECTOMY
Anesthesia: General | Site: Knee | Laterality: Right

## 2014-03-17 MED ORDER — LACTATED RINGERS IV SOLN
INTRAVENOUS | Status: DC
  Administered 2014-03-17 (×2): via INTRAVENOUS

## 2014-03-17 MED ORDER — DEXAMETHASONE SODIUM PHOSPHATE 4 MG/ML IJ SOLN
INTRAMUSCULAR | Status: DC | PRN
  Administered 2014-03-17: 10 mg via INTRAVENOUS

## 2014-03-17 MED ORDER — OXYCODONE HCL 5 MG/5ML PO SOLN: 5.0000 mg | Freq: Once | ORAL | Status: AC | PRN

## 2014-03-17 MED ORDER — CEFAZOLIN SODIUM-DEXTROSE 2-3 GM-% IV SOLR: 2.0000 g | INTRAVENOUS | Status: DC

## 2014-03-17 MED ORDER — OXYCODONE HCL 5 MG PO TABS: 5.0000 mg | ORAL_TABLET | Freq: Once | ORAL | Status: DC | PRN

## 2014-03-17 MED ORDER — OXYCODONE HCL 5 MG PO TABS
5.0000 mg | ORAL_TABLET | Freq: Once | ORAL | Status: AC | PRN
  Administered 2014-03-17: 5 mg via ORAL

## 2014-03-17 MED ORDER — OXYCODONE HCL 5 MG/5ML PO SOLN: 5.0000 mg | Freq: Once | ORAL | Status: DC | PRN

## 2014-03-17 MED ORDER — MIDAZOLAM HCL 5 MG/5ML IJ SOLN
INTRAMUSCULAR | Status: DC | PRN
  Administered 2014-03-17: 2 mg via INTRAVENOUS

## 2014-03-17 MED ORDER — HYDROMORPHONE HCL 1 MG/ML IJ SOLN: 0.2500 mg | INTRAMUSCULAR | Status: DC | PRN

## 2014-03-17 MED ORDER — HYDROCODONE-ACETAMINOPHEN 5-325 MG PO TABS: 1.0000 | ORAL_TABLET | Freq: Four times a day (QID) | ORAL | Status: DC | PRN

## 2014-03-17 MED ORDER — HYDROMORPHONE HCL 1 MG/ML IJ SOLN
INTRAMUSCULAR | Status: AC
  Filled 2014-03-17: qty 1

## 2014-03-17 MED ORDER — ONDANSETRON HCL 4 MG/2ML IJ SOLN: 4.0000 mg | Freq: Once | INTRAMUSCULAR | Status: DC | PRN

## 2014-03-17 MED ORDER — FENTANYL CITRATE 0.05 MG/ML IJ SOLN
INTRAMUSCULAR | Status: DC | PRN
  Administered 2014-03-17: 25 ug via INTRAVENOUS

## 2014-03-17 MED ORDER — HYDROMORPHONE HCL 1 MG/ML IJ SOLN
0.2500 mg | INTRAMUSCULAR | Status: DC | PRN
  Administered 2014-03-17 (×2): 0.5 mg via INTRAVENOUS

## 2014-03-17 MED ORDER — CEFAZOLIN SODIUM-DEXTROSE 2-3 GM-% IV SOLR
INTRAVENOUS | Status: DC | PRN
  Administered 2014-03-17: 2 g via INTRAVENOUS

## 2014-03-17 MED ORDER — ONDANSETRON HCL 4 MG/2ML IJ SOLN: 4.0000 mg | Freq: Four times a day (QID) | INTRAMUSCULAR | Status: DC | PRN

## 2014-03-17 MED ORDER — OXYCODONE HCL 5 MG PO TABS
ORAL_TABLET | ORAL | Status: AC
  Filled 2014-03-17: qty 1

## 2014-03-17 MED ORDER — FENTANYL CITRATE 0.05 MG/ML IJ SOLN
INTRAMUSCULAR | Status: AC
  Filled 2014-03-17: qty 6

## 2014-03-17 MED ORDER — EPINEPHRINE HCL 1 MG/ML IJ SOLN
INTRAMUSCULAR | Status: DC | PRN
  Administered 2014-03-17: 1 mg

## 2014-03-17 MED ORDER — MIDAZOLAM HCL 2 MG/2ML IJ SOLN: 1.0000 mg | INTRAMUSCULAR | Status: DC | PRN

## 2014-03-17 MED ORDER — PROPOFOL 10 MG/ML IV BOLUS
INTRAVENOUS | Status: DC | PRN
  Administered 2014-03-17: 180 mg via INTRAVENOUS
  Administered 2014-03-17: 20 mg via INTRAVENOUS

## 2014-03-17 MED ORDER — BUPIVACAINE-EPINEPHRINE 0.5% -1:200000 IJ SOLN
INTRAMUSCULAR | Status: DC | PRN
  Administered 2014-03-17: 20 mL

## 2014-03-17 MED ORDER — KCL IN DEXTROSE-NACL 20-5-0.2 MEQ/L-%-% IV SOLN: INTRAVENOUS | Status: DC

## 2014-03-17 MED ORDER — SODIUM CHLORIDE 0.9 % IR SOLN
Status: DC | PRN
  Administered 2014-03-17: 1000 mL

## 2014-03-17 MED ORDER — ONDANSETRON HCL 4 MG/2ML IJ SOLN
INTRAMUSCULAR | Status: DC | PRN
  Administered 2014-03-17: 4 mg via INTRAVENOUS

## 2014-03-17 MED ORDER — BUPIVACAINE HCL (PF) 0.5 % IJ SOLN
INTRAMUSCULAR | Status: AC
  Filled 2014-03-17: qty 30

## 2014-03-17 MED ORDER — MIDAZOLAM HCL 2 MG/2ML IJ SOLN
INTRAMUSCULAR | Status: AC
  Filled 2014-03-17: qty 2

## 2014-03-17 MED ORDER — BUPIVACAINE-EPINEPHRINE (PF) 0.5% -1:200000 IJ SOLN
INTRAMUSCULAR | Status: AC
  Filled 2014-03-17: qty 30

## 2014-03-17 MED ORDER — LIDOCAINE HCL (CARDIAC) 20 MG/ML IV SOLN
INTRAVENOUS | Status: DC | PRN
  Administered 2014-03-17: 50 mg via INTRAVENOUS

## 2014-03-17 MED ORDER — FENTANYL CITRATE 0.05 MG/ML IJ SOLN: 50.0000 ug | INTRAMUSCULAR | Status: DC | PRN

## 2014-03-17 MED ORDER — MEPERIDINE HCL 25 MG/ML IJ SOLN: 6.2500 mg | INTRAMUSCULAR | Status: DC | PRN

## 2014-03-17 MED ORDER — CEFAZOLIN SODIUM-DEXTROSE 2-3 GM-% IV SOLR
INTRAVENOUS | Status: AC
  Filled 2014-03-17: qty 50

## 2014-03-17 SURGICAL SUPPLY — 43 items
BANDAGE ELASTIC 6 VELCRO ST LF (GAUZE/BANDAGES/DRESSINGS) ×3 IMPLANT
BLADE 4.2CUDA (BLADE) IMPLANT
BLADE CUTTER GATOR 3.5 (BLADE) ×2 IMPLANT
BLADE GREAT WHITE 4.2 (BLADE) IMPLANT
BLADE GREAT WHITE 4.2MM (BLADE)
BNDG COHESIVE 6X5 TAN STRL LF (GAUZE/BANDAGES/DRESSINGS) ×3 IMPLANT
CANISTER SUCT 3000ML (MISCELLANEOUS) IMPLANT
DRAPE ARTHROSCOPY W/POUCH 114 (DRAPES) ×3 IMPLANT
DRSG PAD ABDOMINAL 8X10 ST (GAUZE/BANDAGES/DRESSINGS) ×2 IMPLANT
DURAPREP 26ML APPLICATOR (WOUND CARE) ×3 IMPLANT
ELECT MENISCUS 165MM 90D (ELECTRODE) IMPLANT
ELECT REM PT RETURN 9FT ADLT (ELECTROSURGICAL)
ELECTRODE REM PT RTRN 9FT ADLT (ELECTROSURGICAL) IMPLANT
GAUZE SPONGE 4X4 12PLY STRL (GAUZE/BANDAGES/DRESSINGS) ×3 IMPLANT
GAUZE SPONGE 4X4 16PLY XRAY LF (GAUZE/BANDAGES/DRESSINGS) ×2 IMPLANT
GAUZE XEROFORM 1X8 LF (GAUZE/BANDAGES/DRESSINGS) ×3 IMPLANT
GLOVE BIO SURGEON STRL SZ7.5 (GLOVE) ×3 IMPLANT
GLOVE BIO SURGEON STRL SZ8.5 (GLOVE) ×3 IMPLANT
GLOVE BIOGEL PI IND STRL 8 (GLOVE) ×1 IMPLANT
GLOVE BIOGEL PI IND STRL 9 (GLOVE) ×1 IMPLANT
GLOVE BIOGEL PI INDICATOR 8 (GLOVE) ×2
GLOVE BIOGEL PI INDICATOR 9 (GLOVE) ×2
GOWN STRL REUS W/ TWL LRG LVL3 (GOWN DISPOSABLE) ×1 IMPLANT
GOWN STRL REUS W/ TWL XL LVL3 (GOWN DISPOSABLE) ×2 IMPLANT
GOWN STRL REUS W/TWL LRG LVL3 (GOWN DISPOSABLE) ×3
GOWN STRL REUS W/TWL XL LVL3 (GOWN DISPOSABLE) ×6
IV NS IRRIG 3000ML ARTHROMATIC (IV SOLUTION) ×3 IMPLANT
KNEE WRAP E Z 3 GEL PACK (MISCELLANEOUS) ×3 IMPLANT
MANIFOLD NEPTUNE II (INSTRUMENTS) ×2 IMPLANT
NDL SAFETY ECLIPSE 18X1.5 (NEEDLE) ×1 IMPLANT
NEEDLE HYPO 18GX1.5 SHARP (NEEDLE) ×3
PACK ARTHROSCOPY DSU (CUSTOM PROCEDURE TRAY) ×3 IMPLANT
PACK BASIN DAY SURGERY FS (CUSTOM PROCEDURE TRAY) ×3 IMPLANT
PAD ALCOHOL SWAB (MISCELLANEOUS) ×3 IMPLANT
PENCIL BUTTON HOLSTER BLD 10FT (ELECTRODE) IMPLANT
SET ARTHROSCOPY TUBING (MISCELLANEOUS) ×3
SET ARTHROSCOPY TUBING LN (MISCELLANEOUS) ×1 IMPLANT
SLEEVE SCD COMPRESS KNEE MED (MISCELLANEOUS) IMPLANT
SYR 3ML 18GX1 1/2 (SYRINGE) IMPLANT
SYR 5ML LL (SYRINGE) ×3 IMPLANT
TOWEL OR 17X24 6PK STRL BLUE (TOWEL DISPOSABLE) ×3 IMPLANT
WAND STAR VAC 90 (SURGICAL WAND) IMPLANT
WATER STERILE IRR 1000ML POUR (IV SOLUTION) ×3 IMPLANT

## 2014-03-17 NOTE — Anesthesia Preprocedure Evaluation (Addendum)
Anesthesia Evaluation  Patient identified by MRN, date of birth, ID band Patient awake    Reviewed: Allergy & Precautions, H&P , NPO status , Patient's Chart, lab work & pertinent test results  Airway Mallampati: II TM Distance: >3 FB Neck ROM: full    Dental   Pulmonary neg pulmonary ROS,          Cardiovascular     Neuro/Psych    GI/Hepatic GERD-  Medicated and Controlled,  Endo/Other  diabetes, Type 2obese  Renal/GU      Musculoskeletal   Abdominal   Peds  Hematology   Anesthesia Other Findings   Reproductive/Obstetrics                          Anesthesia Physical Anesthesia Plan  ASA: II  Anesthesia Plan: General   Post-op Pain Management:    Induction: Intravenous  Airway Management Planned: LMA  Additional Equipment:   Intra-op Plan:   Post-operative Plan:   Informed Consent: I have reviewed the patients History and Physical, chart, labs and discussed the procedure including the risks, benefits and alternatives for the proposed anesthesia with the patient or authorized representative who has indicated his/her understanding and acceptance.     Plan Discussed with: CRNA and Surgeon  Anesthesia Plan Comments:        Anesthesia Quick Evaluation

## 2014-03-17 NOTE — Transfer of Care (Signed)
Immediate Anesthesia Transfer of Care Note  Patient: Lorraine Lee  Procedure(s) Performed: Procedure(s): RIGHT ARTHROSCOPY KNEE,MEDIAL MENISECTOMY AND DEBRIDEMENT CHONDROMALACIA,  (Right)  Patient Location: PACU  Anesthesia Type:General  Level of Consciousness: awake, alert , oriented and patient cooperative  Airway & Oxygen Therapy: Patient Spontanous Breathing and Patient connected to face mask oxygen  Post-op Assessment: Report given to PACU RN and Post -op Vital signs reviewed and stable  Post vital signs: Reviewed and stable  Complications: No apparent anesthesia complications

## 2014-03-17 NOTE — Anesthesia Procedure Notes (Signed)
Procedure Name: LMA Insertion Date/Time: 03/17/2014 1:06 PM Performed by: Toula Moos L Pre-anesthesia Checklist: Patient identified, Emergency Drugs available, Suction available, Patient being monitored and Timeout performed Patient Re-evaluated:Patient Re-evaluated prior to inductionOxygen Delivery Method: Circle System Utilized Preoxygenation: Pre-oxygenation with 100% oxygen Intubation Type: IV induction Ventilation: Mask ventilation without difficulty LMA: LMA inserted LMA Size: 4.0 Number of attempts: 1 Airway Equipment and Method: bite block Placement Confirmation: positive ETCO2 Tube secured with: Tape Dental Injury: Teeth and Oropharynx as per pre-operative assessment

## 2014-03-17 NOTE — Op Note (Signed)
Pre-Op Dx: Right knee medial meniscal tear with chondromalacia  Postop Dx: Same   Procedure: Right knee arthroscopic partial medial meniscectomy posterior and medial horns, debridement chondromalacia grade 3 and grade 4 medial femoral condyle with abrasion arthroplasty  Surgeon: Kathalene Frames. Mayer Camel M.D.  Assist: Kerry Hough. Barton Dubois  (present throughout entire procedure and necessary for timely completion of the procedure) Anes: General LMA  EBL: Minimal  Fluids: 800 cc   Indications: 2 month history of catching popping and pain and inability to squat because of medial joint line pain in the right knee.. Pt has failed conservative treatment with anti-inflammatory medicines, physical therapy, and modified activites but did get temporarily from an intra-articular cortisone injection x 3. Pain has recurred and patient desires elective arthroscopic evaluation and treatment of knee. Risks and benefits of surgery have been discussed and questions answered.  Procedure: Patient identified by arm band and taken to the operating room at the day surgery Center. The appropriate anesthetic monitors were attached, and General LMA anesthesia was induced without difficulty. Lateral post was applied to the table and the lower extremity was prepped and draped in usual sterile fashion from the ankle to the midthigh. Time out procedure was performed. We began the operation by making standard inferior lateral and inferior medial peripatellar portals with a #11 blade allowing introduction of the arthroscope through the inferior lateral portal and the out flow to the inferior medial portal. Pump pressure was set at 100 mmHg and diagnostic arthroscopy  revealed mild chondromalacia of the patellofemoral joint near the apex of the patella lightly debrided with a 3.5 mm Gator sucker shaver. Moving into the medial compartment and medial femoral condyle had grade 3 chondromalacia with focal areas of grade 4 chondromalacia these are  debrided back to a smooth margin including abrasion arthroplasty of the bare bone. The medial meniscus had complex tearing of the posterior and medial horns this is debrider back to a stable margin there is also an area anteriorly that was torn likewise debrided back to a stable margin on the medial side. The anterior cruciate ligament and PCL were intact. The lateral compartment had mild degenerative tearing of the lateral meniscus incidentally debrided. The gutters were cleared medially and laterally. The knee was irrigated out normal saline solution. A dressing of xerofoam 4 x 4 dressing sponges, web roll and an Ace wrap was applied. The patient was awakened extubated and taken to the recovery without difficulty.    Signed: Kerin Salen, MD

## 2014-03-17 NOTE — Interval H&P Note (Signed)
History and Physical Interval Note:  03/17/2014 1:03 PM  Lorraine Lee  has presented today for surgery, with the diagnosis of RIGHT KNEE MEDIAL MENISCUS TEAR/CHONDROMALACIA  The various methods of treatment have been discussed with the patient and family. After consideration of risks, benefits and other options for treatment, the patient has consented to  Procedure(s): RIGHT ARTHROSCOPY KNEE (Right) as a surgical intervention .  The patient's history has been reviewed, patient examined, no change in status, stable for surgery.  I have reviewed the patient's chart and labs.  Questions were answered to the patient's satisfaction.     Kerin Salen

## 2014-03-17 NOTE — Anesthesia Postprocedure Evaluation (Signed)
Anesthesia Post Note  Patient: Product/process development scientist  Procedure(s) Performed: Procedure(s) (LRB): RIGHT ARTHROSCOPY KNEE,MEDIAL MENISECTOMY AND DEBRIDEMENT CHONDROMALACIA,  (Right)  Anesthesia type: General  Patient location: PACU  Post pain: Pain level controlled and Adequate analgesia  Post assessment: Post-op Vital signs reviewed, Patient's Cardiovascular Status Stable, Respiratory Function Stable, Patent Airway and Pain level controlled  Last Vitals:  Filed Vitals:   03/17/14 1520  BP:   Pulse: 68  Temp:   Resp:     Post vital signs: Reviewed and stable  Level of consciousness: awake, alert  and oriented  Complications: No apparent anesthesia complications

## 2014-03-17 NOTE — Discharge Instructions (Addendum)
Arthroscopic Procedure, Knee An arthroscopic procedure can find what is wrong with your knee. PROCEDURE Arthroscopy is a surgical technique that allows your orthopedic surgeon to diagnose and treat your knee injury with accuracy. They will look into your knee through a small instrument. This is almost like a small (pencil sized) telescope. Because arthroscopy affects your knee less than open knee surgery, you can anticipate a more rapid recovery. Taking an active role by following your caregiver's instructions will help with rapid and complete recovery. Use crutches, rest, elevation, ice, and knee exercises as instructed. The length of recovery depends on various factors including type of injury, age, physical condition, medical conditions, and your rehabilitation. Your knee is the joint between the large bones (femur and tibia) in your leg. Cartilage covers these bone ends which are smooth and slippery and allow your knee to bend and move smoothly. Two menisci, thick, semi-lunar shaped pads of cartilage which form a rim inside the joint, help absorb shock and stabilize your knee. Ligaments bind the bones together and support your knee joint. Muscles move the joint, help support your knee, and take stress off the joint itself. Because of this all programs and physical therapy to rehabilitate an injured or repaired knee require rebuilding and strengthening your muscles. AFTER THE PROCEDURE  After the procedure, you will be moved to a recovery area until most of the effects of the medication have worn off. Your caregiver will discuss the test results with you.  Only take over-the-counter or prescription medicines for pain, discomfort, or fever as directed by your caregiver. SEEK MEDICAL CARE IF:   You have increased bleeding from your wounds.  You see redness, swelling, or have increasing pain in your wounds.  You have pus coming from your wound.  You have an oral temperature above 102 F (38.9  C).  You notice a bad smell coming from the wound or dressing.  You have severe pain with any motion of your knee. SEEK IMMEDIATE MEDICAL CARE IF:   You develop a rash.  You have difficulty breathing.  You have any allergic problems. Document Released: 05/11/2000 Document Revised: 08/06/2011 Document Reviewed: 12/03/2007 [facility] Patient Information 2015 [place], [place]. This information is not intended to replace advice given to you by your health care provider. Make sure you discuss any questions you have with your health care provider.    Post Anesthesia Home Care Instructions  Activity: Get plenty of rest for the remainder of the day. A responsible adult should stay with you for 24 hours following the procedure.  For the next 24 hours, DO NOT: -Drive a car -Paediatric nurse -Drink alcoholic beverages -Take any medication unless instructed by your physician -Make any legal decisions or sign important papers.  Meals: Start with liquid foods such as gelatin or soup. Progress to regular foods as tolerated. Avoid greasy, spicy, heavy foods. If nausea and/or vomiting occur, drink only clear liquids until the nausea and/or vomiting subsides. Call your physician if vomiting continues.  Special Instructions/Symptoms: Your throat may feel dry or sore from the anesthesia or the breathing tube placed in your throat during surgery. If this causes discomfort, gargle with warm salt water. The discomfort should disappear within 24 hours.   Call your surgeon if you experience:   1.  Fever over 101.0. 2.  Inability to urinate. 3.  Nausea and/or vomiting. 4.  Extreme swelling or bruising at the surgical site. 5.  Continued bleeding from the incision. 6.  Increased pain, redness or drainage  from the incision. 7.  Problems related to your pain medication. 8. Any change in color, movement and/or sensation 9. Any problems and/or concerns

## 2014-03-18 ENCOUNTER — Encounter (HOSPITAL_BASED_OUTPATIENT_CLINIC_OR_DEPARTMENT_OTHER): Payer: Self-pay | Admitting: Orthopedic Surgery

## 2014-03-25 ENCOUNTER — Encounter: Payer: Self-pay | Admitting: Orthopedic Surgery

## 2014-03-28 ENCOUNTER — Encounter: Payer: Self-pay | Admitting: Orthopedic Surgery

## 2014-04-27 ENCOUNTER — Encounter: Payer: Self-pay | Admitting: Orthopedic Surgery

## 2014-06-29 ENCOUNTER — Ambulatory Visit (INDEPENDENT_AMBULATORY_CARE_PROVIDER_SITE_OTHER): Payer: 59 | Admitting: General Surgery

## 2014-06-29 ENCOUNTER — Encounter: Payer: Self-pay | Admitting: General Surgery

## 2014-06-29 VITALS — BP 110/70 | HR 82 | Temp 96.1°F | Resp 16 | Ht 65.0 in | Wt 203.0 lb

## 2014-06-29 DIAGNOSIS — R1084 Generalized abdominal pain: Secondary | ICD-10-CM

## 2014-06-29 DIAGNOSIS — K565 Intestinal adhesions [bands] with obstruction (postprocedural) (postinfection): Secondary | ICD-10-CM

## 2014-06-29 LAB — COMPREHENSIVE METABOLIC PANEL
ALK PHOS: 57 U/L (ref 46–116)
ALT: 62 U/L (ref 14–63)
ANION GAP: 11 (ref 7–16)
Albumin: 4.1 g/dL (ref 3.4–5.0)
BUN: 16 mg/dL (ref 7–18)
Bilirubin,Total: 0.5 mg/dL (ref 0.2–1.0)
CALCIUM: 9.7 mg/dL (ref 8.5–10.1)
Chloride: 108 mmol/L — ABNORMAL HIGH (ref 98–107)
Co2: 26 mmol/L (ref 21–32)
Creatinine: 0.89 mg/dL (ref 0.60–1.30)
EGFR (African American): >60
EGFR (Non-African Amer.): >60
Glucose: 193 mg/dL — ABNORMAL HIGH (ref 65–99)
Osmolality: 295 (ref 275–301)
Potassium: 3.5 mmol/L (ref 3.5–5.1)
SGOT(AST): 43 U/L — ABNORMAL HIGH (ref 15–37)
Sodium: 145 mmol/L (ref 136–145)
Total Protein: 8 g/dL (ref 6.4–8.2)

## 2014-06-29 LAB — LIPASE, BLOOD: LIPASE: 190 U/L (ref 73–393)

## 2014-06-29 LAB — CBC WITH DIFFERENTIAL/PLATELET
BASOS ABS: 0 10*3/uL (ref 0.0–0.1)
Basophil %: 0.2 %
EOS PCT: 0.1 %
Eosinophil #: 0 10*3/uL (ref 0.0–0.7)
HCT: 47.2 % — AB (ref 35.0–47.0)
HGB: 15.9 g/dL (ref 12.0–16.0)
LYMPHS ABS: 0.5 10*3/uL — AB (ref 1.0–3.6)
Lymphocyte %: 3.3 %
MCH: 30.8 pg (ref 26.0–34.0)
MCHC: 33.7 g/dL (ref 32.0–36.0)
MCV: 92 fL (ref 80–100)
Monocyte #: 0.8 x10 3/mm (ref 0.2–0.9)
Monocyte %: 5.4 %
NEUTROS ABS: 14.1 10*3/uL — AB (ref 1.4–6.5)
Neutrophil %: 91 %
PLATELETS: 258 10*3/uL (ref 150–440)
RBC: 5.15 10*6/uL (ref 3.80–5.20)
RDW: 13.5 % (ref 11.5–14.5)
WBC: 15.5 10*3/uL — ABNORMAL HIGH (ref 3.6–11.0)

## 2014-06-29 NOTE — Patient Instructions (Signed)
Follow up as scheduled.  

## 2014-06-29 NOTE — Progress Notes (Signed)
Patient ID: Lorraine Lee, female   DOB: 10-09-1950, 64 y.o.   MRN: [id number]  Chief Complaint  Patient presents with  . Abdominal Pain    HPI Lorraine Lee is a 64 y.o. female.  Here today for evaluation of sudden onset of vomiting and mid abdominal pain that started last night. She states she had been feeling fine prior to last night and she vomited all night. Bowels have been described as normal and she had a small BM last night.  HPI  Past Medical History  Diagnosis Date  . Diverticulosis   . Anemia   . Internal hemorrhoids without mention of complication   . Personal history of malignant neoplasm of large intestine   . Diabetes mellitus without complication   . GERD (gastroesophageal reflux disease)   . Cancer     colon  . Gall stone     Past Surgical History  Procedure Laterality Date  . Colon resection  2005  . Cesarean section      x4  . Ileostomy closure    . Colonoscopy with esophagogastroduodenoscopy (egd)  2015  . Knee arthroscopy with medial menisectomy Right 03/17/2014    Procedure: RIGHT ARTHROSCOPY KNEE,MEDIAL MENISECTOMY AND DEBRIDEMENT CHONDROMALACIA, ;  Surgeon: Kerin Salen, MD;  Location: [place];  Service: Orthopedics;  Laterality: Right;    Family History  Problem Relation Age of Onset  . Colon cancer Maternal Uncle   . Diabetes Father   . Heart disease Father     Social History History  Substance Use Topics  . Smoking status: Never Smoker   . Smokeless tobacco: Never Used  . Alcohol Use: No    Allergies  Allergen Reactions  . Sulfonamide Derivatives     REACTION: rash  . Tetanus Toxoid     REACTION: fever and swelling of site    Current Outpatient Prescriptions  Medication Sig Dispense Refill  . Choline Fenofibrate (TRILIPIX) 135 MG capsule Take 135 mg by mouth daily.    Marland Kitchen HYDROcodone-acetaminophen (NORCO) 5-325 MG per tablet Take 1 tablet by mouth every 6 (six) hours as needed. 60 tablet 0  . metFORMIN  (GLUCOPHAGE) 500 MG tablet Take by mouth daily with breakfast.    . Multiple Vitamin (MULTIVITAMIN) tablet Take 1 tablet by mouth daily.    Marland Kitchen omeprazole (PRILOSEC) 40 MG capsule Take 1 capsule (40 mg total) by mouth daily. 30 capsule 1  . rosuvastatin (CRESTOR) 20 MG tablet Take 20 mg by mouth daily.    Marland Kitchen venlafaxine (EFFEXOR) 37.5 MG tablet Take 37.5 mg by mouth every morning.      No current facility-administered medications for this visit.    Review of Systems Review of Systems  Constitutional: Positive for fatigue.  Respiratory: Negative.   Cardiovascular: Negative.   Gastrointestinal: Positive for vomiting and abdominal pain.    Blood pressure 110/70, pulse 82, temperature 96.1 F (35.6 C), temperature source Oral, resp. rate 16, height 5\' 5"  (1.651 m), weight 203 lb (92.08 kg).  Physical Exam Physical Exam  Constitutional: She is oriented to person, place, and time. She appears well-developed and well-nourished.  Eyes: Conjunctivae are normal. No scleral icterus.  Neck: Neck supple.  Cardiovascular: Normal rate, regular rhythm and normal heart sounds.   Pulmonary/Chest: Effort normal and breath sounds normal.  Abdominal: There is tenderness. A hernia is present.    reducible hernia present near umbilicus  Lymphadenopathy:    She has no cervical adenopathy.  Neurological: She is alert and oriented to  person, place, and time.  Skin: Skin is warm and dry.    Data Reviewed none  Assessment    Abdominal pain, n/v. Possible small bowel obstruction. Pt is not comfortable and likely will benefit with IV, symptomatic meds and evaluation    Plan    Admit to hospital. IV, NPO, get labs and CT abdomen and pelvis. Plan ok with pt.       SANKAR,SEEPLAPUTHUR G 06/29/2014, 10:03 AM

## 2014-06-30 ENCOUNTER — Inpatient Hospital Stay: Payer: Self-pay | Admitting: General Surgery

## 2014-06-30 ENCOUNTER — Encounter: Payer: Self-pay | Admitting: General Surgery

## 2014-06-30 LAB — BASIC METABOLIC PANEL
Anion Gap: 7 (ref 7–16)
BUN: 17 mg/dL (ref 7–18)
Calcium, Total: 8.6 mg/dL (ref 8.5–10.1)
Chloride: 104 mmol/L (ref 98–107)
Co2: 32 mmol/L (ref 21–32)
Creatinine: 1.07 mg/dL (ref 0.60–1.30)
GFR CALC NON AF AMER: 55 — AB
GLUCOSE: 151 mg/dL — AB (ref 65–99)
Osmolality: 289 (ref 275–301)
POTASSIUM: 3.4 mmol/L — AB (ref 3.5–5.1)
SODIUM: 143 mmol/L (ref 136–145)

## 2014-06-30 LAB — CBC WITH DIFFERENTIAL/PLATELET
BASOS ABS: 0 10*3/uL (ref 0.0–0.1)
Basophil %: 0.3 %
EOS ABS: 0.1 10*3/uL (ref 0.0–0.7)
Eosinophil %: 0.7 %
HCT: 39.6 % (ref 35.0–47.0)
HGB: 13.2 g/dL (ref 12.0–16.0)
LYMPHS ABS: 1.6 10*3/uL (ref 1.0–3.6)
Lymphocyte %: 19.5 %
MCH: 30.7 pg (ref 26.0–34.0)
MCHC: 33.3 g/dL (ref 32.0–36.0)
MCV: 92 fL (ref 80–100)
Monocyte #: 1.1 x10 3/mm — ABNORMAL HIGH (ref 0.2–0.9)
Monocyte %: 12.5 %
NEUTROS ABS: 5.6 10*3/uL (ref 1.4–6.5)
Neutrophil %: 67 %
Platelet: 237 10*3/uL (ref 150–440)
RBC: 4.3 10*6/uL (ref 3.80–5.20)
RDW: 13.6 % (ref 11.5–14.5)
WBC: 8.4 10*3/uL (ref 3.6–11.0)

## 2014-07-01 ENCOUNTER — Encounter: Payer: Self-pay | Admitting: General Surgery

## 2014-07-01 LAB — POTASSIUM: POTASSIUM: 3.4 mmol/L — AB (ref 3.5–5.1)

## 2014-07-01 IMAGING — CR DG ABDOMEN 2V
1 series · 3 of 3 positions shown · non-contrast
Comparison: CT Abdomen and Pelvis [DATE]

CLINICAL DATA: 63-year-old female with possible bowel obstruction.
Initial encounter.

EXAM:
ABDOMEN - 2 VIEW

[Series 1: dxr abdomen 2 v flat and erect · 0.14mm/px · 3 of 3 slices shown]
[im 1/3]
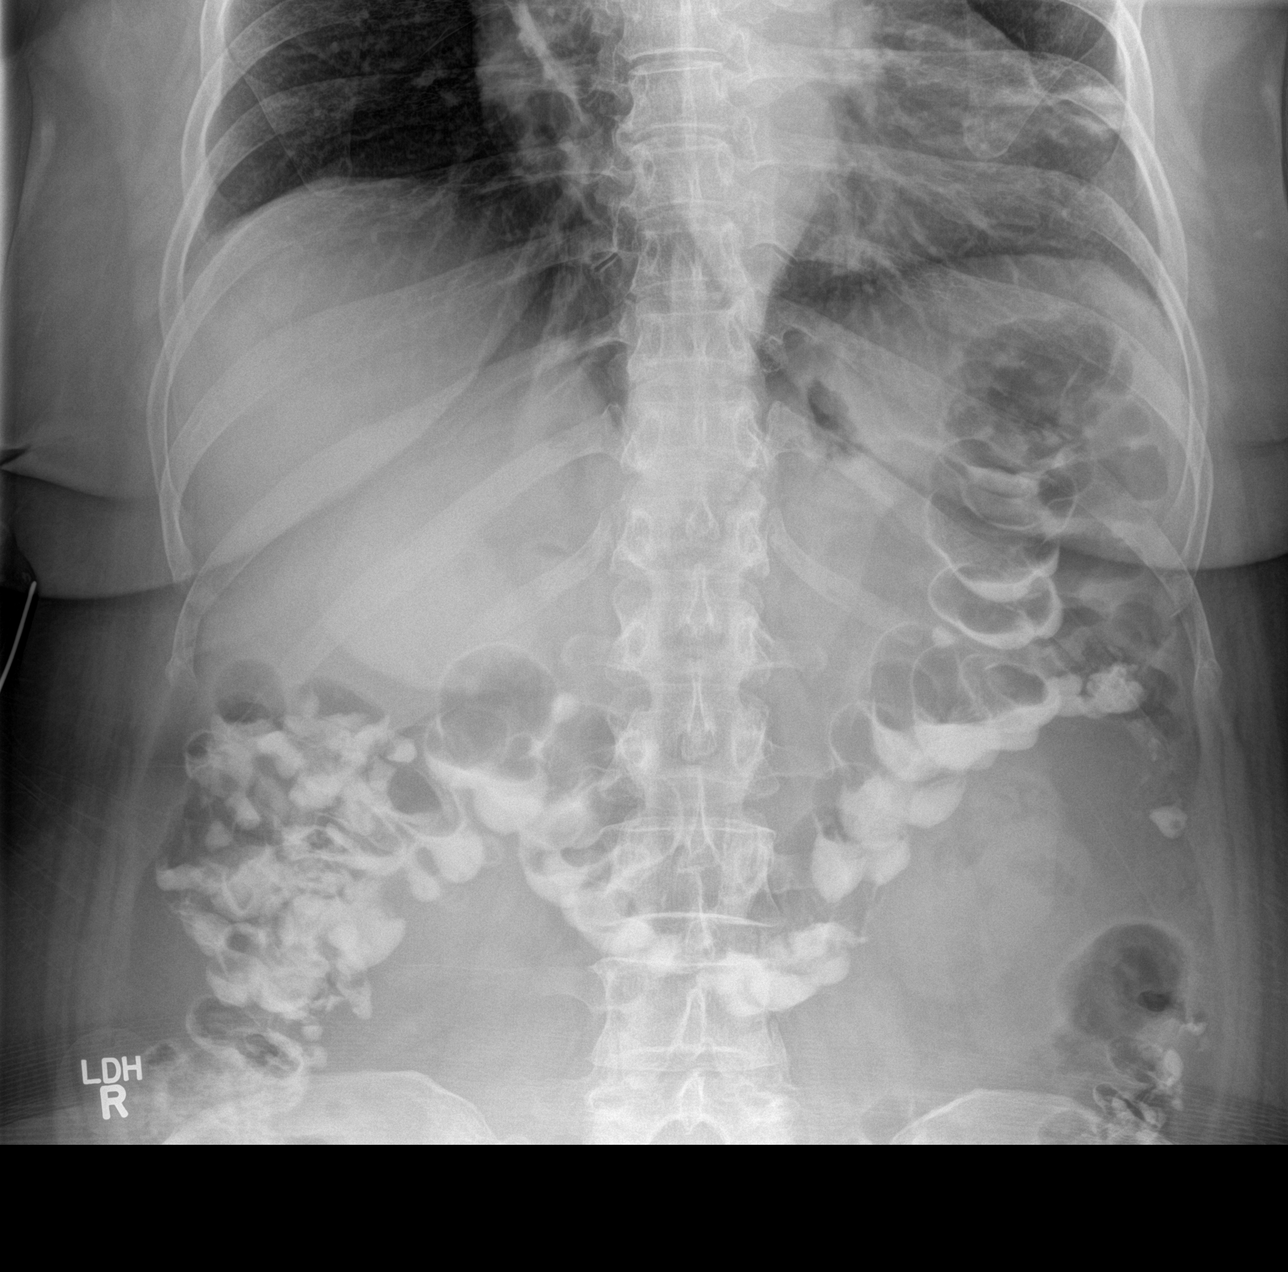
[im 2/3]
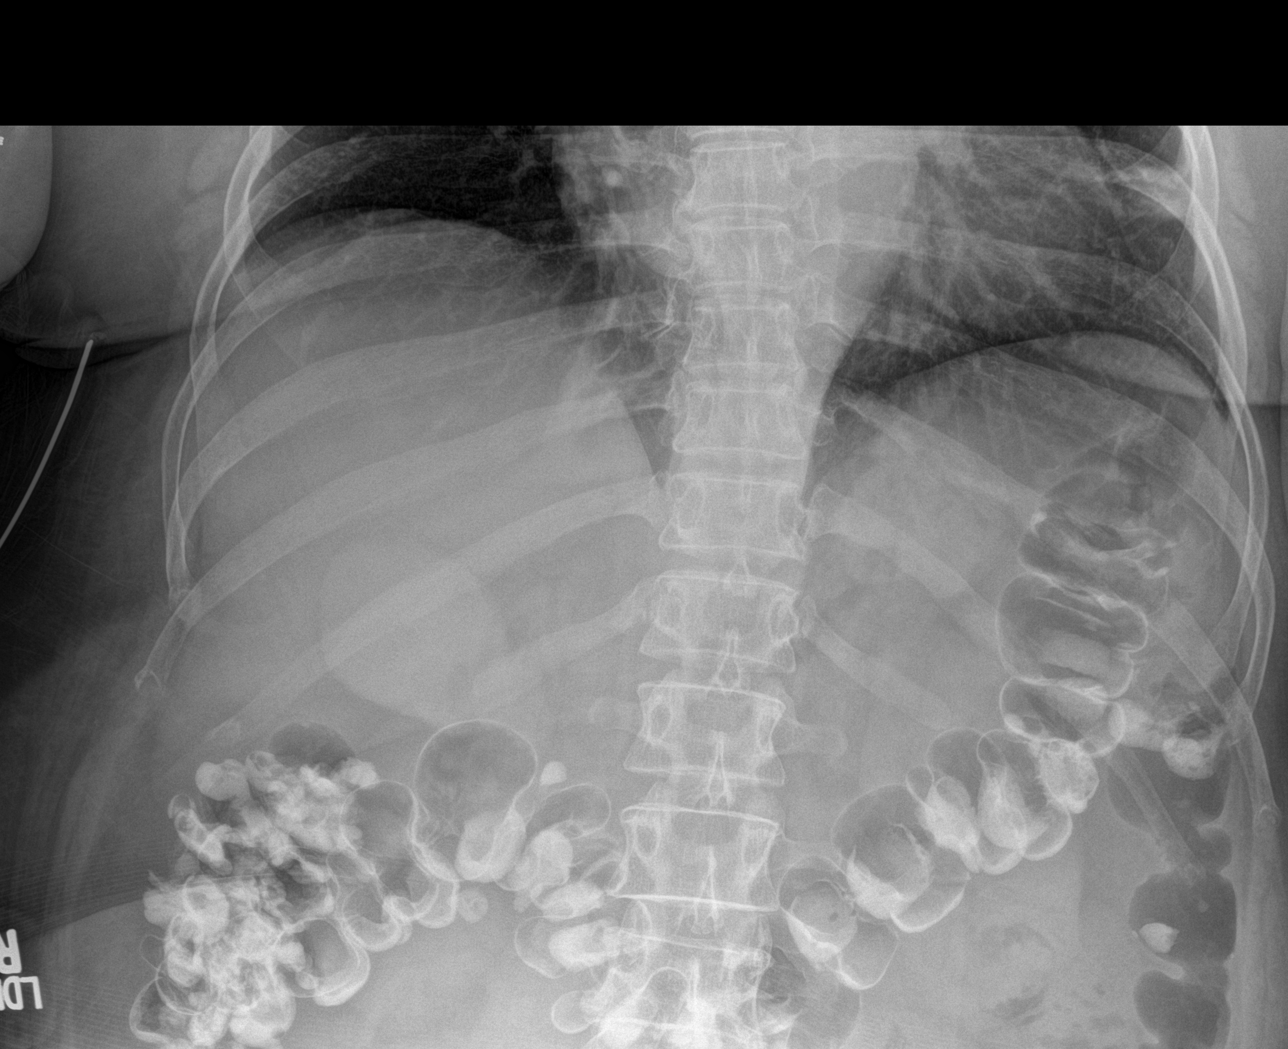
[im 3/3]
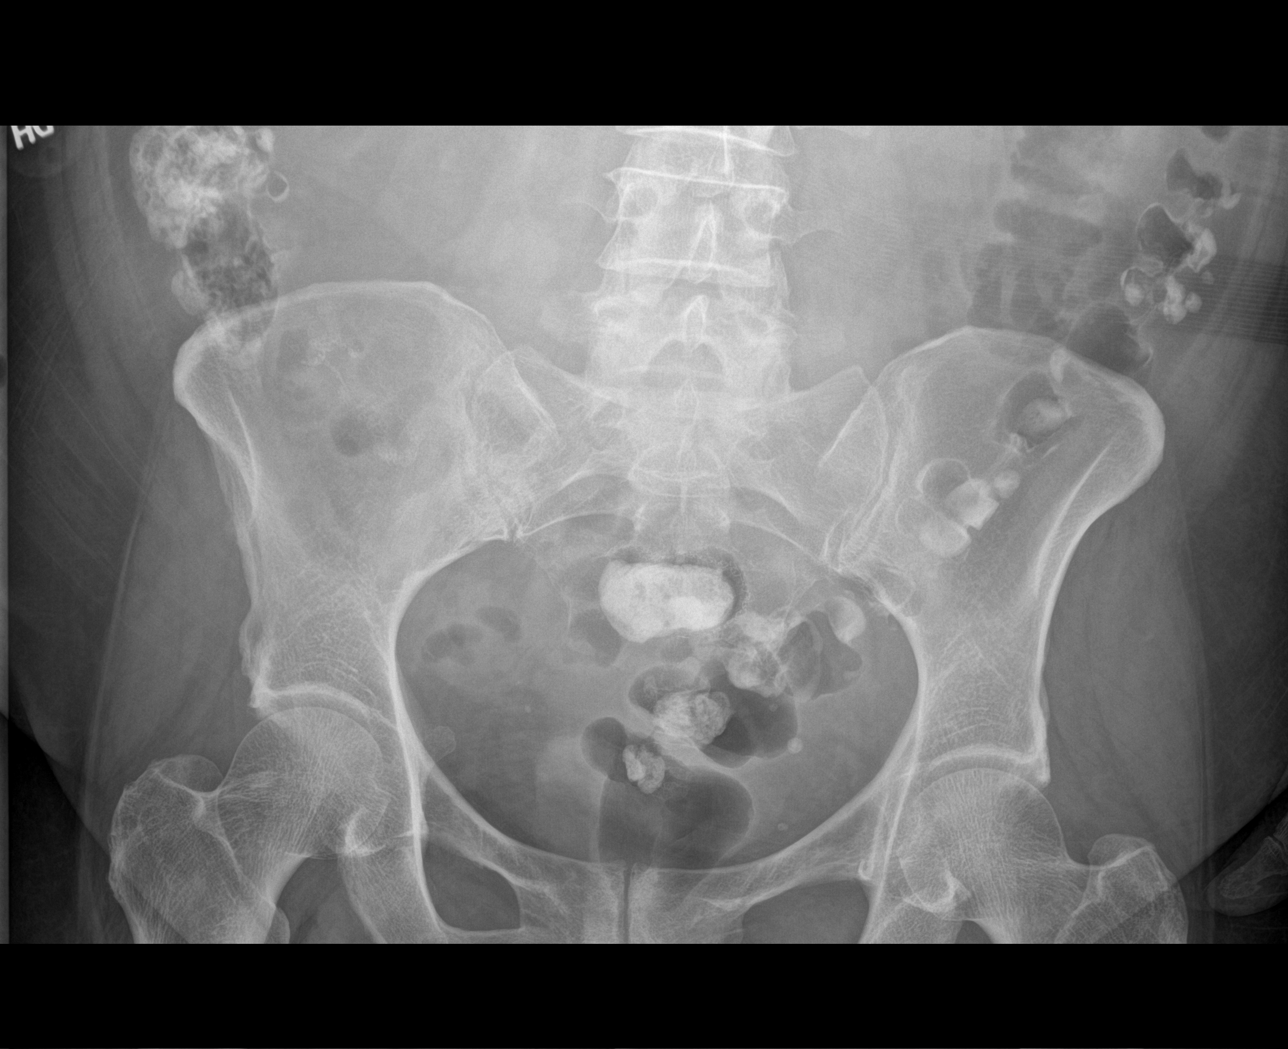

[3 of 3 positions shown; findings below may reference images not displayed]

FINDINGS: Upright and supine views of the abdomen and pelvis. Oral contrast
now throughout the colon. Colonic diverticulosis. Some vicarious
contrast excretion to the gallbladder is noted. No dilated or
abnormal small bowel loops. No pneumoperitoneum. Other abdominal and
pelvic visceral contours are within normal limits. No acute osseous
abnormality identified.
IMPRESSION: Normal bowel gas pattern, no free air. Oral contrast now throughout
the colon. Colonic diverticulosis.

## 2015-04-28 ENCOUNTER — Encounter: Payer: Self-pay | Admitting: Internal Medicine

## 2015-12-27 ENCOUNTER — Other Ambulatory Visit: Payer: Self-pay | Admitting: Internal Medicine

## 2015-12-27 DIAGNOSIS — E1165 Type 2 diabetes mellitus with hyperglycemia: Secondary | ICD-10-CM | POA: Diagnosis not present

## 2015-12-27 DIAGNOSIS — M25579 Pain in unspecified ankle and joints of unspecified foot: Secondary | ICD-10-CM | POA: Diagnosis not present

## 2015-12-27 DIAGNOSIS — Z13820 Encounter for screening for osteoporosis: Secondary | ICD-10-CM

## 2015-12-27 DIAGNOSIS — E559 Vitamin D deficiency, unspecified: Secondary | ICD-10-CM | POA: Diagnosis not present

## 2015-12-27 DIAGNOSIS — I10 Essential (primary) hypertension: Secondary | ICD-10-CM | POA: Diagnosis not present

## 2016-01-23 ENCOUNTER — Ambulatory Visit: Payer: 59 | Attending: Internal Medicine

## 2016-07-06 DIAGNOSIS — Z01419 Encounter for gynecological examination (general) (routine) without abnormal findings: Secondary | ICD-10-CM | POA: Diagnosis not present

## 2016-07-06 DIAGNOSIS — Z124 Encounter for screening for malignant neoplasm of cervix: Secondary | ICD-10-CM | POA: Diagnosis not present

## 2016-07-19 DIAGNOSIS — G8929 Other chronic pain: Secondary | ICD-10-CM | POA: Diagnosis not present

## 2016-07-19 DIAGNOSIS — M722 Plantar fascial fibromatosis: Secondary | ICD-10-CM | POA: Diagnosis not present

## 2016-07-19 DIAGNOSIS — M79672 Pain in left foot: Secondary | ICD-10-CM | POA: Diagnosis not present

## 2016-07-25 ENCOUNTER — Other Ambulatory Visit: Payer: Self-pay | Admitting: Internal Medicine

## 2016-07-25 DIAGNOSIS — Z1231 Encounter for screening mammogram for malignant neoplasm of breast: Secondary | ICD-10-CM

## 2016-08-22 ENCOUNTER — Ambulatory Visit
Admission: RE | Admit: 2016-08-22 | Discharge: 2016-08-22 | Disposition: A | Discharge status: Home / Self Care | Payer: 59 | Source: Ambulatory Visit | Attending: Internal Medicine | Admitting: Internal Medicine

## 2016-08-22 DIAGNOSIS — Z1231 Encounter for screening mammogram for malignant neoplasm of breast: Secondary | ICD-10-CM | POA: Insufficient documentation

## 2016-08-22 DIAGNOSIS — R928 Other abnormal and inconclusive findings on diagnostic imaging of breast: Secondary | ICD-10-CM | POA: Diagnosis not present

## 2016-08-22 HISTORY — DX: Malignant neoplasm of colon, unspecified: C18.9

## 2016-08-23 DIAGNOSIS — M722 Plantar fascial fibromatosis: Secondary | ICD-10-CM | POA: Diagnosis not present

## 2016-08-28 ENCOUNTER — Other Ambulatory Visit: Payer: Self-pay | Admitting: Internal Medicine

## 2016-08-28 DIAGNOSIS — R928 Other abnormal and inconclusive findings on diagnostic imaging of breast: Secondary | ICD-10-CM

## 2016-08-28 DIAGNOSIS — N6320 Unspecified lump in the left breast, unspecified quadrant: Secondary | ICD-10-CM

## 2016-09-06 ENCOUNTER — Ambulatory Visit: Payer: 59

## 2016-09-06 ENCOUNTER — Other Ambulatory Visit: Payer: 59

## 2016-09-10 ENCOUNTER — Ambulatory Visit
Admission: RE | Admit: 2016-09-10 | Discharge: 2016-09-10 | Disposition: A | Discharge status: Home / Self Care | Payer: 59 | Source: Ambulatory Visit | Attending: Internal Medicine | Admitting: Internal Medicine

## 2016-09-10 DIAGNOSIS — N6320 Unspecified lump in the left breast, unspecified quadrant: Secondary | ICD-10-CM

## 2016-09-10 DIAGNOSIS — N6489 Other specified disorders of breast: Secondary | ICD-10-CM | POA: Diagnosis not present

## 2016-09-10 DIAGNOSIS — R928 Other abnormal and inconclusive findings on diagnostic imaging of breast: Secondary | ICD-10-CM

## 2016-09-13 DIAGNOSIS — M79672 Pain in left foot: Secondary | ICD-10-CM | POA: Diagnosis not present

## 2016-09-13 DIAGNOSIS — M722 Plantar fascial fibromatosis: Secondary | ICD-10-CM | POA: Diagnosis not present

## 2016-09-13 DIAGNOSIS — G8929 Other chronic pain: Secondary | ICD-10-CM | POA: Diagnosis not present

## 2016-10-11 DIAGNOSIS — M722 Plantar fascial fibromatosis: Secondary | ICD-10-CM | POA: Diagnosis not present

## 2016-10-11 DIAGNOSIS — G8929 Other chronic pain: Secondary | ICD-10-CM | POA: Diagnosis not present

## 2016-10-11 DIAGNOSIS — M79672 Pain in left foot: Secondary | ICD-10-CM | POA: Diagnosis not present

## 2016-10-17 DIAGNOSIS — M722 Plantar fascial fibromatosis: Secondary | ICD-10-CM | POA: Diagnosis not present

## 2016-10-24 DIAGNOSIS — M722 Plantar fascial fibromatosis: Secondary | ICD-10-CM | POA: Diagnosis not present

## 2017-04-17 ENCOUNTER — Ambulatory Visit (INDEPENDENT_AMBULATORY_CARE_PROVIDER_SITE_OTHER): Payer: 59 | Admitting: General Surgery

## 2017-04-17 VITALS — BP 130/80 | HR 70 | Resp 14 | Ht 66.0 in | Wt 193.0 lb

## 2017-04-17 DIAGNOSIS — R1013 Epigastric pain: Secondary | ICD-10-CM

## 2017-04-17 DIAGNOSIS — K429 Umbilical hernia without obstruction or gangrene: Secondary | ICD-10-CM

## 2017-04-17 NOTE — Patient Instructions (Signed)
Patient to be scheduled for ct scan .

## 2017-04-17 NOTE — Progress Notes (Signed)
Patient ID: Lorraine Lee, female   DOB: Feb 13, 1951, 66 y.o.   MRN: [id number]  Chief Complaint  Patient presents with  . Abdominal Pain    HPI Lorraine Lee is a 66 y.o. female here today for a evaluation of epigastric pain .She states on Monday the pain started she was vomiting and the pain lasted for about an hour. She has had this problem in the past after meals. No fever or chills.   HPI  Past Medical History:  Diagnosis Date  . Anemia   . Colon cancer ([place])    chemo tx   . Diabetes mellitus without complication ([place])   . Diverticulosis   . Gall stone   . GERD (gastroesophageal reflux disease)   . Internal hemorrhoids without mention of complication   . Personal history of malignant neoplasm of large intestine     Past Surgical History:  Procedure Laterality Date  . CESAREAN SECTION     x4  . COLON RESECTION  2005  . COLONOSCOPY WITH ESOPHAGOGASTRODUODENOSCOPY (EGD)  2015  . ILEOSTOMY CLOSURE    . KNEE ARTHROSCOPY WITH MEDIAL MENISECTOMY Right 03/17/2014   Procedure: RIGHT ARTHROSCOPY KNEE,MEDIAL MENISECTOMY AND DEBRIDEMENT CHONDROMALACIA, ;  Surgeon: Kerin Salen, MD;  Location: [place];  Service: Orthopedics;  Laterality: Right;    Family History  Problem Relation Age of Onset  . Diabetes Father   . Heart disease Father   . Colon cancer Maternal Uncle     Social History Social History   Tobacco Use  . Smoking status: Never Smoker  . Smokeless tobacco: Never Used  Substance Use Topics  . Alcohol use: No  . Drug use: No    Allergies  Allergen Reactions  . Sulfonamide Derivatives     REACTION: rash  . Tetanus Toxoid     REACTION: fever and swelling of site    Current Outpatient Medications  Medication Sig Dispense Refill  . Choline Fenofibrate (TRILIPIX) 135 MG capsule Take 135 mg by mouth daily.    . metFORMIN (GLUCOPHAGE) 500 MG tablet Take by mouth daily with breakfast.    . Multiple Vitamin (MULTIVITAMIN) tablet Take 1 tablet  by mouth daily.    Marland Kitchen omeprazole (PRILOSEC) 40 MG capsule Take 1 capsule (40 mg total) by mouth daily. 30 capsule 1  . rosuvastatin (CRESTOR) 20 MG tablet Take 20 mg by mouth daily.    Marland Kitchen venlafaxine (EFFEXOR) 37.5 MG tablet Take 37.5 mg by mouth every morning.      No current facility-administered medications for this visit.     Review of Systems Review of Systems  Respiratory: Negative.   Cardiovascular: Negative.   Gastrointestinal: Positive for abdominal pain, nausea and vomiting.    Blood pressure 130/80, pulse 70, resp. rate 14, height 5\' 6"  (1.676 m), weight 193 lb (87.5 kg).  Physical Exam Physical Exam  Constitutional: She is oriented to person, place, and time. She appears well-developed and well-nourished.  Eyes: Conjunctivae are normal. No scleral icterus.  Neck: Neck supple.  Cardiovascular: Normal rate, regular rhythm and normal heart sounds.  Pulmonary/Chest: Effort normal and breath sounds normal.  Abdominal: Soft. Normal appearance. There is no tenderness. A hernia (umbilical hernia) is present.    Lymphadenopathy:    She has no cervical adenopathy.    She has no axillary adenopathy.  Neurological: She is alert and oriented to person, place, and time.  Skin: Skin is warm and dry.    Data Reviewed None Assessment    Epigastric  pain, n/v, suggestive of biliary source    Plan     Patient to be scheduled for ultrasound. LFTs  HPI, Physical Exam, Assessment and Plan have been scribed under the direction and in the presence of Mckinley Jewel, MD  Gaspar Cola, CMA     I have completed the exam and reviewed the above documentation for accuracy and completeness.  I agree with the above.  Haematologist has been used and any errors in dictation or transcription are unintentional.  Hana Trippett G. Jamal Collin, M.D., F.A.C.S.    Junie Panning G 04/22/2017, 4:39 PM  Patient has been scheduled for a gallbladder ultrasound at [facility] for 04-23-17 at 8 am  (arrive 7:45 am). Prep: NPO after midnight. Message left on cell phone with date, time, and instructions.   Dominga Ferry, CMA

## 2017-04-17 NOTE — H&P (View-Only) (Signed)
Patient ID: Lorraine Lee, female   DOB: [birth year], 66 y.o.   MRN: [id number]  Chief Complaint  Patient presents with  . Abdominal Pain    HPI Lorraine Lee is a 66 y.o. female here today for a evaluation of epigastric pain .She states on Monday the pain started she was vomiting and the pain lasted for about an hour. She has had this problem in the past after meals. No fever or chills.   HPI  Past Medical History:  Diagnosis Date  . Anemia   . Colon cancer ([place])    chemo tx   . Diabetes mellitus without complication ([place])   . Diverticulosis   . Gall stone   . GERD (gastroesophageal reflux disease)   . Internal hemorrhoids without mention of complication   . Personal history of malignant neoplasm of large intestine     Past Surgical History:  Procedure Laterality Date  . CESAREAN SECTION     x4  . COLON RESECTION  2005  . COLONOSCOPY WITH ESOPHAGOGASTRODUODENOSCOPY (EGD)  2015  . ILEOSTOMY CLOSURE    . KNEE ARTHROSCOPY WITH MEDIAL MENISECTOMY Right 03/17/2014   Procedure: RIGHT ARTHROSCOPY KNEE,MEDIAL MENISECTOMY AND DEBRIDEMENT CHONDROMALACIA, ;  Surgeon: Lorraine Salen, MD;  Location: [place];  Service: Orthopedics;  Laterality: Right;    Family History  Problem Relation Age of Onset  . Diabetes Father   . Heart disease Father   . Colon cancer Maternal Uncle     Social History Social History   Tobacco Use  . Smoking status: Never Smoker  . Smokeless tobacco: Never Used  Substance Use Topics  . Alcohol use: No  . Drug use: No    Allergies  Allergen Reactions  . Sulfonamide Derivatives     REACTION: rash  . Tetanus Toxoid     REACTION: fever and swelling of site    Current Outpatient Medications  Medication Sig Dispense Refill  . Choline Fenofibrate (TRILIPIX) 135 MG capsule Take 135 mg by mouth daily.    . metFORMIN (GLUCOPHAGE) 500 MG tablet Take by mouth daily with breakfast.    . Multiple Vitamin (MULTIVITAMIN) tablet Take 1 tablet  by mouth daily.    Marland Kitchen omeprazole (PRILOSEC) 40 MG capsule Take 1 capsule (40 mg total) by mouth daily. 30 capsule 1  . rosuvastatin (CRESTOR) 20 MG tablet Take 20 mg by mouth daily.    Marland Kitchen venlafaxine (EFFEXOR) 37.5 MG tablet Take 37.5 mg by mouth every morning.      No current facility-administered medications for this visit.     Review of Systems Review of Systems  Respiratory: Negative.   Cardiovascular: Negative.   Gastrointestinal: Positive for abdominal pain, nausea and vomiting.    Blood pressure 130/80, pulse 70, resp. rate 14, height 5\' 6"  (1.676 m), weight 193 lb (87.5 kg).  Physical Exam Physical Exam  Constitutional: She is oriented to person, place, and time. She appears well-developed and well-nourished.  Eyes: Conjunctivae are normal. No scleral icterus.  Neck: Neck supple.  Cardiovascular: Normal rate, regular rhythm and normal heart sounds.  Pulmonary/Chest: Effort normal and breath sounds normal.  Abdominal: Soft. Normal appearance. There is no tenderness. A hernia (umbilical hernia) is present.    Lymphadenopathy:    She has no cervical adenopathy.    She has no axillary adenopathy.  Neurological: She is alert and oriented to person, place, and time.  Skin: Skin is warm and dry.    Data Reviewed None Assessment    Epigastric  pain, n/v, suggestive of biliary source    Plan     Patient to be scheduled for ultrasound. LFTs  HPI, Physical Exam, Assessment and Plan have been scribed under the direction and in the presence of Lorraine Jewel, MD  Lorraine Lee, CMA     I have completed the exam and reviewed the above documentation for accuracy and completeness.  I agree with the above.  Haematologist has been used and any errors in dictation or transcription are unintentional.  Lorraine Lee, M.D., F.A.C.S.    Lorraine Lee 04/22/2017, 4:39 PM  Patient has been scheduled for a gallbladder ultrasound at [facility] for 04-23-17 at 8 am  (arrive 7:45 am). Prep: NPO after midnight. Message left on cell phone with date, time, and instructions.   Lorraine Lee, CMA

## 2017-04-22 ENCOUNTER — Encounter: Payer: Self-pay | Admitting: General Surgery

## 2017-04-23 ENCOUNTER — Ambulatory Visit
Admission: RE | Admit: 2017-04-23 | Discharge: 2017-04-23 | Disposition: A | Discharge status: Home / Self Care | Payer: 59 | Source: Ambulatory Visit | Attending: General Surgery | Admitting: General Surgery

## 2017-04-23 DIAGNOSIS — K8020 Calculus of gallbladder without cholecystitis without obstruction: Secondary | ICD-10-CM | POA: Diagnosis not present

## 2017-04-23 DIAGNOSIS — K760 Fatty (change of) liver, not elsewhere classified: Secondary | ICD-10-CM | POA: Insufficient documentation

## 2017-04-23 DIAGNOSIS — R1013 Epigastric pain: Secondary | ICD-10-CM

## 2017-04-24 ENCOUNTER — Encounter: Payer: Self-pay | Admitting: Internal Medicine

## 2017-04-25 ENCOUNTER — Other Ambulatory Visit: Payer: Self-pay | Admitting: General Surgery

## 2017-04-25 ENCOUNTER — Telehealth: Payer: Self-pay | Admitting: *Deleted

## 2017-04-25 DIAGNOSIS — K8010 Calculus of gallbladder with chronic cholecystitis without obstruction: Secondary | ICD-10-CM

## 2017-04-25 DIAGNOSIS — K429 Umbilical hernia without obstruction or gangrene: Secondary | ICD-10-CM

## 2017-04-25 NOTE — Telephone Encounter (Signed)
Patient's surgery has been scheduled for 04-30-17 at [facility].

## 2017-04-26 ENCOUNTER — Other Ambulatory Visit: Payer: Medicare Other

## 2017-04-26 ENCOUNTER — Inpatient Hospital Stay: Admission: RE | Admit: 2017-04-26 | Payer: Medicare Other | Source: Ambulatory Visit

## 2017-04-29 ENCOUNTER — Other Ambulatory Visit: Payer: Self-pay

## 2017-04-29 ENCOUNTER — Encounter
Admission: RE | Admit: 2017-04-29 | Discharge: 2017-04-29 | Disposition: A | Discharge status: Home / Self Care | Payer: 59 | Source: Ambulatory Visit | Attending: General Surgery | Admitting: General Surgery

## 2017-04-29 HISTORY — DX: Essential (primary) hypertension: I10

## 2017-04-29 HISTORY — DX: Hypothyroidism, unspecified: E03.9

## 2017-04-29 MED ORDER — CEFAZOLIN SODIUM-DEXTROSE 2-4 GM/100ML-% IV SOLN
2.0000 g | INTRAVENOUS | Status: AC
  Administered 2017-04-30: 2 g via INTRAVENOUS

## 2017-04-29 NOTE — Patient Instructions (Signed)
  Your procedure is scheduled on: 04-30-17 Report to Same Day Surgery 2nd floor medical mall [facility] Entrance-take elevator on left to 2nd floor.  Check in with surgery information desk.) To find out your arrival time please call [phone] between 1PM - 3PM on 04-29-17  Remember: Instructions that are not followed completely may result in serious medical risk, up to and including death, or upon the discretion of your surgeon and anesthesiologist your surgery may need to be rescheduled.    _x___ 1. Do not eat food after midnight the night before your procedure. You may drink WATER up to 2 hours before you are scheduled to arrive at the hospital for your procedure.  Do not drink WATER within 2 hours of your scheduled arrival to the hospital.  Type 1 and type 2 diabetics should only drink water.  No gum chewing or hard candies.     __x__ 2. No Alcohol for 24 hours before or after surgery.   __x__3. No Smoking for 24 prior to surgery.   ____  4. Bring all medications with you on the day of surgery if instructed.    __x__ 5. Notify your doctor if there is any change in your medical condition     (cold, fever, infections).     Do not wear jewelry, make-up, hairpins, clips or nail polish.  Do not wear lotions, powders, or perfumes. You may wear deodorant.  Do not shave 48 hours prior to surgery. Men may shave face and neck.  Do not bring valuables to the hospital.    [facility] is not responsible for any belongings or valuables.               Contacts, dentures or bridgework may not be worn into surgery.  Leave your suitcase in the car. After surgery it may be brought to your room.  For patients admitted to the hospital, discharge time is determined by your  treatment team.   Patients discharged the day of surgery will not be allowed to drive home.  You will need someone to drive you home and stay with you the night of your procedure.    _x___ TAKE THE FOLLOWING MEDICATIONS THE  MORNING OF SURGERY WITH A SMALL SIP OF WATER. These include:  1. ROSUVASTATIN  2. EFFEXOR  3.  4.  5.  6.  ____Fleets enema or Magnesium Citrate as directed.   ____ Use CHG Soap or sage wipes as directed on instruction sheet   ____ Use inhalers on the day of surgery and bring to hospital day of surgery  _X___ Stop Metformin and Janumet 2 days prior to surgery-STOP METFORMIN NOW-LAST DOSE ON 04-28-17   ____ Take 1/2 of usual insulin dose the night before surgery and none on the morning surgery.   ____ Follow recommendations from Cardiologist, Pulmonologist or PCP regarding stopping Aspirin, Coumadin, Plavix ,Eliquis, Effient, or Pradaxa, and Pletal.  X____Stop Anti-inflammatories such as Advil, Aleve, Ibuprofen, Motrin, Naproxen, Naprosyn, Goodies powders or aspirin products NOW-OK to take Tylenol    ____ Stop supplements until after surgery.     ____ Bring C-Pap to the hospital.

## 2017-04-30 ENCOUNTER — Ambulatory Visit: Payer: 59 | Admitting: Registered Nurse

## 2017-04-30 ENCOUNTER — Encounter
Admission: RE | Disposition: A | Discharge status: Home / Self Care | Payer: Self-pay | Source: Ambulatory Visit | Attending: General Surgery

## 2017-04-30 ENCOUNTER — Other Ambulatory Visit: Payer: Self-pay

## 2017-04-30 ENCOUNTER — Observation Stay
Admission: RE | Admit: 2017-04-30 | Discharge: 2017-05-02 | Disposition: A | Discharge status: Home / Self Care | Payer: 59 | Source: Ambulatory Visit | Attending: General Surgery | Admitting: General Surgery

## 2017-04-30 ENCOUNTER — Encounter: Payer: Self-pay | Admitting: *Deleted

## 2017-04-30 ENCOUNTER — Ambulatory Visit: Payer: 59

## 2017-04-30 DIAGNOSIS — Z9221 Personal history of antineoplastic chemotherapy: Secondary | ICD-10-CM | POA: Diagnosis not present

## 2017-04-30 DIAGNOSIS — Z85038 Personal history of other malignant neoplasm of large intestine: Secondary | ICD-10-CM | POA: Insufficient documentation

## 2017-04-30 DIAGNOSIS — K660 Peritoneal adhesions (postprocedural) (postinfection): Secondary | ICD-10-CM | POA: Insufficient documentation

## 2017-04-30 DIAGNOSIS — Z23 Encounter for immunization: Secondary | ICD-10-CM | POA: Insufficient documentation

## 2017-04-30 DIAGNOSIS — K8020 Calculus of gallbladder without cholecystitis without obstruction: Secondary | ICD-10-CM | POA: Diagnosis not present

## 2017-04-30 DIAGNOSIS — Z9049 Acquired absence of other specified parts of digestive tract: Secondary | ICD-10-CM | POA: Insufficient documentation

## 2017-04-30 DIAGNOSIS — E119 Type 2 diabetes mellitus without complications: Secondary | ICD-10-CM | POA: Insufficient documentation

## 2017-04-30 DIAGNOSIS — Z7984 Long term (current) use of oral hypoglycemic drugs: Secondary | ICD-10-CM | POA: Insufficient documentation

## 2017-04-30 DIAGNOSIS — K219 Gastro-esophageal reflux disease without esophagitis: Secondary | ICD-10-CM | POA: Insufficient documentation

## 2017-04-30 DIAGNOSIS — Z79899 Other long term (current) drug therapy: Secondary | ICD-10-CM | POA: Diagnosis not present

## 2017-04-30 DIAGNOSIS — K432 Incisional hernia without obstruction or gangrene: Secondary | ICD-10-CM | POA: Diagnosis not present

## 2017-04-30 DIAGNOSIS — Z8719 Personal history of other diseases of the digestive system: Secondary | ICD-10-CM | POA: Diagnosis not present

## 2017-04-30 DIAGNOSIS — K8010 Calculus of gallbladder with chronic cholecystitis without obstruction: Secondary | ICD-10-CM | POA: Insufficient documentation

## 2017-04-30 DIAGNOSIS — R1013 Epigastric pain: Principal | ICD-10-CM | POA: Insufficient documentation

## 2017-04-30 DIAGNOSIS — K429 Umbilical hernia without obstruction or gangrene: Secondary | ICD-10-CM | POA: Diagnosis not present

## 2017-04-30 DIAGNOSIS — Z5331 Laparoscopic surgical procedure converted to open procedure: Secondary | ICD-10-CM | POA: Diagnosis not present

## 2017-04-30 DIAGNOSIS — I10 Essential (primary) hypertension: Secondary | ICD-10-CM | POA: Diagnosis not present

## 2017-04-30 HISTORY — PX: UMBILICAL HERNIA REPAIR: SHX196

## 2017-04-30 HISTORY — PX: CHOLECYSTECTOMY: SHX55

## 2017-04-30 LAB — GLUCOSE, CAPILLARY
GLUCOSE-CAPILLARY: 126 mg/dL — AB (ref 65–99)
GLUCOSE-CAPILLARY: 183 mg/dL — AB (ref 65–99)

## 2017-04-30 SURGERY — LAPAROSCOPIC CHOLECYSTECTOMY WITH INTRAOPERATIVE CHOLANGIOGRAM
Anesthesia: General

## 2017-04-30 MED ORDER — KETOROLAC TROMETHAMINE 15 MG/ML IJ SOLN
15.0000 mg | Freq: Four times a day (QID) | INTRAMUSCULAR | Status: AC
  Administered 2017-04-30: 15 mg via INTRAVENOUS
  Filled 2017-04-30: qty 1

## 2017-04-30 MED ORDER — MIDAZOLAM HCL 2 MG/2ML IJ SOLN
INTRAMUSCULAR | Status: AC
  Filled 2017-04-30: qty 2

## 2017-04-30 MED ORDER — ROSUVASTATIN CALCIUM 10 MG PO TABS
20.0000 mg | ORAL_TABLET | ORAL | Status: DC
  Administered 2017-05-01 – 2017-05-02 (×2): 20 mg via ORAL
  Filled 2017-04-30 (×2): qty 2

## 2017-04-30 MED ORDER — OXYCODONE HCL 5 MG PO TABS
5.0000 mg | ORAL_TABLET | ORAL | Status: DC | PRN
  Administered 2017-04-30 – 2017-05-02 (×4): 5 mg via ORAL
  Filled 2017-04-30 (×4): qty 1

## 2017-04-30 MED ORDER — KETOROLAC TROMETHAMINE 30 MG/ML IJ SOLN
INTRAMUSCULAR | Status: AC
  Filled 2017-04-30: qty 1

## 2017-04-30 MED ORDER — ACETAMINOPHEN 10 MG/ML IV SOLN
INTRAVENOUS | Status: AC
  Filled 2017-04-30: qty 100

## 2017-04-30 MED ORDER — ROCURONIUM BROMIDE 50 MG/5ML IV SOLN
INTRAVENOUS | Status: AC
  Filled 2017-04-30: qty 1

## 2017-04-30 MED ORDER — ACETAMINOPHEN 10 MG/ML IV SOLN
INTRAVENOUS | Status: DC | PRN
  Administered 2017-04-30: 1000 mg via INTRAVENOUS

## 2017-04-30 MED ORDER — PNEUMOCOCCAL VAC POLYVALENT 25 MCG/0.5ML IJ INJ
0.5000 mL | INJECTION | INTRAMUSCULAR | Status: AC
  Administered 2017-05-01: 0.5 mL via INTRAMUSCULAR
  Filled 2017-04-30: qty 0.5

## 2017-04-30 MED ORDER — ZOLPIDEM TARTRATE 5 MG PO TABS: 5.0000 mg | ORAL_TABLET | Freq: Every evening | ORAL | Status: DC | PRN

## 2017-04-30 MED ORDER — INFLUENZA VAC SPLIT HIGH-DOSE 0.5 ML IM SUSY
0.5000 mL | PREFILLED_SYRINGE | INTRAMUSCULAR | Status: AC
  Administered 2017-05-01: 0.5 mL via INTRAMUSCULAR
  Filled 2017-04-30: qty 0.5

## 2017-04-30 MED ORDER — SODIUM CHLORIDE 0.9 % IJ SOLN
INTRAMUSCULAR | Status: AC
  Filled 2017-04-30: qty 50

## 2017-04-30 MED ORDER — CEFAZOLIN SODIUM-DEXTROSE 2-4 GM/100ML-% IV SOLN
INTRAVENOUS | Status: AC
  Filled 2017-04-30: qty 100

## 2017-04-30 MED ORDER — ADULT MULTIVITAMIN W/MINERALS CH
1.0000 | ORAL_TABLET | Freq: Every day | ORAL | Status: DC
  Administered 2017-04-30 – 2017-05-02 (×3): 1 via ORAL
  Filled 2017-04-30 (×6): qty 1

## 2017-04-30 MED ORDER — LIDOCAINE HCL (PF) 2 % IJ SOLN
INTRAMUSCULAR | Status: AC
  Filled 2017-04-30: qty 10

## 2017-04-30 MED ORDER — SODIUM CHLORIDE 0.9 % IV SOLN
INTRAVENOUS | Status: DC
  Administered 2017-04-30: 13:00:00 via INTRAVENOUS
  Administered 2017-04-30: 50 mL/h via INTRAVENOUS

## 2017-04-30 MED ORDER — FENTANYL CITRATE (PF) 100 MCG/2ML IJ SOLN
INTRAMUSCULAR | Status: AC
  Filled 2017-04-30: qty 2

## 2017-04-30 MED ORDER — KETOROLAC TROMETHAMINE 15 MG/ML IJ SOLN: 15.0000 mg | Freq: Four times a day (QID) | INTRAMUSCULAR | Status: DC | PRN

## 2017-04-30 MED ORDER — ROCURONIUM BROMIDE 100 MG/10ML IV SOLN
INTRAVENOUS | Status: DC | PRN
  Administered 2017-04-30: 40 mg via INTRAVENOUS
  Administered 2017-04-30: 20 mg via INTRAVENOUS
  Administered 2017-04-30: 10 mg via INTRAVENOUS

## 2017-04-30 MED ORDER — LOSARTAN POTASSIUM 25 MG PO TABS
25.0000 mg | ORAL_TABLET | ORAL | Status: DC
  Administered 2017-05-02: 25 mg via ORAL
  Filled 2017-04-30 (×2): qty 1

## 2017-04-30 MED ORDER — MIDAZOLAM HCL 2 MG/2ML IJ SOLN
INTRAMUSCULAR | Status: DC | PRN
  Administered 2017-04-30: 2 mg via INTRAVENOUS

## 2017-04-30 MED ORDER — ONDANSETRON HCL 4 MG/2ML IJ SOLN
INTRAMUSCULAR | Status: AC
  Filled 2017-04-30: qty 2

## 2017-04-30 MED ORDER — ONDANSETRON HCL 4 MG/2ML IJ SOLN: 4.0000 mg | Freq: Once | INTRAMUSCULAR | Status: DC | PRN

## 2017-04-30 MED ORDER — FAMOTIDINE 20 MG PO TABS
20.0000 mg | ORAL_TABLET | Freq: Once | ORAL | Status: AC
  Administered 2017-04-30: 20 mg via ORAL

## 2017-04-30 MED ORDER — KETOROLAC TROMETHAMINE 30 MG/ML IJ SOLN
INTRAMUSCULAR | Status: DC | PRN
Start: 2017-04-30 — End: 2017-04-30
  Administered 2017-04-30: 30 mg via INTRAVENOUS

## 2017-04-30 MED ORDER — VITAMIN D 1000 UNITS PO TABS
5000.0000 [IU] | ORAL_TABLET | Freq: Every day | ORAL | Status: DC
  Administered 2017-05-02: 5000 [IU] via ORAL
  Filled 2017-04-30 (×3): qty 5

## 2017-04-30 MED ORDER — EPHEDRINE SULFATE 50 MG/ML IJ SOLN
INTRAMUSCULAR | Status: DC | PRN
  Administered 2017-04-30 (×6): 10 mg via INTRAVENOUS
  Administered 2017-04-30: 20 mg via INTRAVENOUS
  Administered 2017-04-30: 10 mg via INTRAVENOUS

## 2017-04-30 MED ORDER — PROPOFOL 10 MG/ML IV BOLUS
INTRAVENOUS | Status: AC
  Filled 2017-04-30: qty 20

## 2017-04-30 MED ORDER — BUPIVACAINE HCL (PF) 0.5 % IJ SOLN
INTRAMUSCULAR | Status: AC
  Filled 2017-04-30: qty 30

## 2017-04-30 MED ORDER — PANTOPRAZOLE SODIUM 40 MG IV SOLR
40.0000 mg | Freq: Every day | INTRAVENOUS | Status: DC
  Administered 2017-04-30 – 2017-05-01 (×2): 40 mg via INTRAVENOUS
  Filled 2017-04-30 (×2): qty 40

## 2017-04-30 MED ORDER — PROPOFOL 10 MG/ML IV BOLUS
INTRAVENOUS | Status: DC | PRN
  Administered 2017-04-30: 20 mg via INTRAVENOUS
  Administered 2017-04-30: 130 mg via INTRAVENOUS

## 2017-04-30 MED ORDER — DEXAMETHASONE SODIUM PHOSPHATE 10 MG/ML IJ SOLN
INTRAMUSCULAR | Status: DC | PRN
  Administered 2017-04-30: 10 mg via INTRAVENOUS

## 2017-04-30 MED ORDER — PHENYLEPHRINE HCL 10 MG/ML IJ SOLN
INTRAMUSCULAR | Status: DC | PRN
Start: 2017-04-30 — End: 2017-04-30
  Administered 2017-04-30 (×3): 100 ug via INTRAVENOUS

## 2017-04-30 MED ORDER — SUGAMMADEX SODIUM 200 MG/2ML IV SOLN
INTRAVENOUS | Status: DC | PRN
  Administered 2017-04-30: 200 mg via INTRAVENOUS

## 2017-04-30 MED ORDER — ACETAMINOPHEN 325 MG PO TABS: 650.0000 mg | ORAL_TABLET | Freq: Four times a day (QID) | ORAL | Status: DC | PRN

## 2017-04-30 MED ORDER — FAMOTIDINE 20 MG PO TABS
ORAL_TABLET | ORAL | Status: AC
  Administered 2017-04-30: 20 mg via ORAL
  Filled 2017-04-30: qty 1

## 2017-04-30 MED ORDER — ONDANSETRON HCL 4 MG/2ML IJ SOLN: 4.0000 mg | Freq: Four times a day (QID) | INTRAMUSCULAR | Status: DC | PRN

## 2017-04-30 MED ORDER — CHLORHEXIDINE GLUCONATE CLOTH 2 % EX PADS
6.0000 | MEDICATED_PAD | Freq: Once | CUTANEOUS | Status: DC
  Administered 2017-04-30: 6 via TOPICAL

## 2017-04-30 MED ORDER — ONDANSETRON 4 MG PO TBDP
4.0000 mg | ORAL_TABLET | Freq: Four times a day (QID) | ORAL | Status: DC | PRN
Start: 2017-04-30 — End: 2017-05-02

## 2017-04-30 MED ORDER — FENTANYL CITRATE (PF) 100 MCG/2ML IJ SOLN
25.0000 ug | INTRAMUSCULAR | Status: AC | PRN
  Administered 2017-04-30 (×6): 25 ug via INTRAVENOUS

## 2017-04-30 MED ORDER — SODIUM CHLORIDE 0.45 % IV SOLN
INTRAVENOUS | Status: DC
  Administered 2017-04-30 – 2017-05-02 (×3): via INTRAVENOUS

## 2017-04-30 MED ORDER — VENLAFAXINE HCL ER 75 MG PO CP24
150.0000 mg | ORAL_CAPSULE | Freq: Every day | ORAL | Status: DC
  Administered 2017-05-01 – 2017-05-02 (×2): 150 mg via ORAL
  Filled 2017-04-30 (×2): qty 2

## 2017-04-30 MED ORDER — MORPHINE SULFATE (PF) 2 MG/ML IV SOLN
2.0000 mg | INTRAVENOUS | Status: DC | PRN
  Administered 2017-04-30: 2 mg via INTRAVENOUS
  Filled 2017-04-30: qty 1

## 2017-04-30 MED ORDER — FENTANYL CITRATE (PF) 100 MCG/2ML IJ SOLN
INTRAMUSCULAR | Status: DC | PRN
  Administered 2017-04-30: 50 ug via INTRAVENOUS
  Administered 2017-04-30: 25 ug via INTRAVENOUS
  Administered 2017-04-30: 50 ug via INTRAVENOUS
  Administered 2017-04-30 (×3): 25 ug via INTRAVENOUS

## 2017-04-30 MED ORDER — ACETAMINOPHEN 10 MG/ML IV SOLN
1000.0000 mg | Freq: Four times a day (QID) | INTRAVENOUS | Status: DC
  Administered 2017-05-01 – 2017-05-02 (×2): 1000 mg via INTRAVENOUS
  Filled 2017-04-30 (×4): qty 100

## 2017-04-30 MED ORDER — METHOCARBAMOL 500 MG PO TABS
500.0000 mg | ORAL_TABLET | Freq: Four times a day (QID) | ORAL | Status: DC | PRN
  Filled 2017-04-30: qty 1

## 2017-04-30 MED ORDER — DEXAMETHASONE SODIUM PHOSPHATE 10 MG/ML IJ SOLN
INTRAMUSCULAR | Status: AC
  Filled 2017-04-30: qty 1

## 2017-04-30 MED ORDER — LIDOCAINE HCL (CARDIAC) 20 MG/ML IV SOLN
INTRAVENOUS | Status: DC | PRN
  Administered 2017-04-30: 100 mg via INTRAVENOUS

## 2017-04-30 MED ORDER — ONDANSETRON HCL 4 MG/2ML IJ SOLN
INTRAMUSCULAR | Status: DC | PRN
  Administered 2017-04-30: 4 mg via INTRAVENOUS

## 2017-04-30 MED ORDER — CALCIUM CARBONATE ANTACID 500 MG PO CHEW: 1.0000 | CHEWABLE_TABLET | ORAL | Status: DC | PRN

## 2017-04-30 MED ORDER — ACETAMINOPHEN 650 MG RE SUPP: 650.0000 mg | Freq: Four times a day (QID) | RECTAL | Status: DC | PRN

## 2017-04-30 MED ORDER — KETOROLAC TROMETHAMINE 15 MG/ML IJ SOLN
15.0000 mg | Freq: Three times a day (TID) | INTRAMUSCULAR | Status: AC
  Administered 2017-04-30 – 2017-05-01 (×3): 15 mg via INTRAVENOUS
  Filled 2017-04-30 (×3): qty 1

## 2017-04-30 SURGICAL SUPPLY — 67 items
ADH SKN CLS APL DERMABOND .7 (GAUZE/BANDAGES/DRESSINGS) ×2
AGENT HMST MTR 8 SURGIFLO (HEMOSTASIS)
ANCHOR TIS RET SYS 235ML (MISCELLANEOUS) ×1 IMPLANT
APL ESCP 34 STRL LF DISP (HEMOSTASIS)
APPLICATOR SURGIFLO ENDO (HEMOSTASIS) IMPLANT
APPLIER CLIP 11 MED OPEN (CLIP)
APPLIER CLIP LOGIC TI 5 (MISCELLANEOUS) ×3 IMPLANT
APR CLP MED 11 20 MLT OPN (CLIP)
APR CLP MED LRG 33X5 (MISCELLANEOUS) ×1
BAG TISS RTRVL C235 10X14 (MISCELLANEOUS)
BLADE SURG 11 STRL SS SAFETY (MISCELLANEOUS) ×3 IMPLANT
BLADE SURG 15 STRL SS SAFETY (BLADE) ×3 IMPLANT
CANISTER SUCT 1200ML W/VALVE (MISCELLANEOUS) ×3 IMPLANT
CANNULA DILATOR 10 W/SLV (CANNULA) ×2 IMPLANT
CANNULA DILATOR 10MM W/SLV (CANNULA) ×1
CATH CHOLANG 76X19 KUMAR (CATHETERS) ×1 IMPLANT
CATH REDDICK CHOLANGI 4FR 50CM (CATHETERS) ×2 IMPLANT
CHLORAPREP W/TINT 26ML (MISCELLANEOUS) ×3 IMPLANT
CLIP APPLIE 11 MED OPEN (CLIP) IMPLANT
CLOSURE WOUND 1/2 X4 (GAUZE/BANDAGES/DRESSINGS)
DECANTER SPIKE VIAL GLASS SM (MISCELLANEOUS) ×6 IMPLANT
DEFOGGER SCOPE WARMER CLEARIFY (MISCELLANEOUS) ×1 IMPLANT
DERMABOND ADVANCED (GAUZE/BANDAGES/DRESSINGS) ×4
DERMABOND ADVANCED .7 DNX12 (GAUZE/BANDAGES/DRESSINGS) ×1 IMPLANT
DRAPE C-ARM XRAY 36X54 (DRAPES) ×3 IMPLANT
DRAPE INCISE IOBAN 66X45 STRL (DRAPES) ×3 IMPLANT
DRAPE LAPAROTOMY 100X77 ABD (DRAPES) ×3 IMPLANT
DRSG OPSITE POSTOP 4X8 (GAUZE/BANDAGES/DRESSINGS) ×3 IMPLANT
DRSG TEGADERM 2-3/8X2-3/4 SM (GAUZE/BANDAGES/DRESSINGS) ×4 IMPLANT
DRSG TELFA 4X3 1S NADH ST (GAUZE/BANDAGES/DRESSINGS) ×3 IMPLANT
ELECT REM PT RETURN 9FT ADLT (ELECTROSURGICAL) ×3
ELECTRODE REM PT RTRN 9FT ADLT (ELECTROSURGICAL) ×1 IMPLANT
GLOVE BIO SURGEON STRL SZ7 (GLOVE) ×6 IMPLANT
GOWN STRL REUS W/ TWL LRG LVL3 (GOWN DISPOSABLE) ×3 IMPLANT
GOWN STRL REUS W/TWL LRG LVL3 (GOWN DISPOSABLE) ×9
GRASPER SUT TROCAR 14GX15 (MISCELLANEOUS) ×3 IMPLANT
HANDLE YANKAUER SUCT BULB TIP (MISCELLANEOUS) ×2 IMPLANT
HEMOSTAT SURGICEL 2X3 (HEMOSTASIS) IMPLANT
IRRIGATION STRYKERFLOW (MISCELLANEOUS) ×1 IMPLANT
IRRIGATOR STRYKERFLOW (MISCELLANEOUS)
IV LACTATED RINGERS 1000ML (IV SOLUTION) ×1 IMPLANT
KIT RM TURNOVER STRD PROC AR (KITS) ×3 IMPLANT
LABEL OR SOLS (LABEL) ×1 IMPLANT
NDL HYPO 25X1 1.5 SAFETY (NEEDLE) ×1 IMPLANT
NDL INSUFF ACCESS 14 VERSASTEP (NEEDLE) ×3 IMPLANT
NEEDLE HYPO 25X1 1.5 SAFETY (NEEDLE) ×3 IMPLANT
NS IRRIG 500ML POUR BTL (IV SOLUTION) ×3 IMPLANT
PACK BASIN MINOR ARMC (MISCELLANEOUS) ×3 IMPLANT
PACK LAP CHOLECYSTECTOMY (MISCELLANEOUS) ×3 IMPLANT
SCISSORS METZENBAUM CVD 33 (INSTRUMENTS) IMPLANT
SLEEVE ENDOPATH XCEL 5M (ENDOMECHANICALS) ×6 IMPLANT
SPOGE SURGIFLO 8M (HEMOSTASIS)
SPONGE LAP 18X18 5 PK (GAUZE/BANDAGES/DRESSINGS) ×4 IMPLANT
SPONGE SURGIFLO 8M (HEMOSTASIS) IMPLANT
STRIP CLOSURE SKIN 1/2X4 (GAUZE/BANDAGES/DRESSINGS) ×1 IMPLANT
SUT PROLENE 0 CT 2 (SUTURE) ×2 IMPLANT
SUT VIC AB 0 SH 27 (SUTURE) ×5 IMPLANT
SUT VIC AB 2-0 SH 27 (SUTURE) ×3
SUT VIC AB 2-0 SH 27XBRD (SUTURE) ×1 IMPLANT
SUT VIC AB 3-0 SH 27 (SUTURE) ×3
SUT VIC AB 3-0 SH 27X BRD (SUTURE) ×1 IMPLANT
SUT VIC AB 4-0 FS2 27 (SUTURE) ×3 IMPLANT
SUT VICRYL+ 3-0 144IN (SUTURE) ×3 IMPLANT
SWABSTK COMLB BENZOIN TINCTURE (MISCELLANEOUS) ×3 IMPLANT
SYR CONTROL 10ML (SYRINGE) ×3 IMPLANT
TROCAR XCEL NON-BLD 5MMX100MML (ENDOMECHANICALS) ×3 IMPLANT
TUBING INSUFFLATION (TUBING) ×3 IMPLANT

## 2017-04-30 NOTE — Anesthesia Preprocedure Evaluation (Signed)
Anesthesia Evaluation  Patient identified by MRN, date of birth, ID band Patient awake    Reviewed: Allergy & Precautions, H&P , NPO status , Patient's Chart, lab work & pertinent test results  Airway Mallampati: II  TM Distance: >3 FB Neck ROM: full    Dental   Pulmonary neg pulmonary ROS,    Pulmonary exam normal        Cardiovascular hypertension, Pt. on medications Normal cardiovascular exam     Neuro/Psych negative neurological ROS  negative psych ROS   GI/Hepatic GERD  Medicated and Controlled,  Endo/Other  diabetes, Type 2Hypothyroidism obese  Renal/GU      Musculoskeletal   Abdominal Normal abdominal exam  (+)   Peds  Hematology  (+) anemia ,   Anesthesia Other Findings   Reproductive/Obstetrics                             Anesthesia Physical  Anesthesia Plan  ASA: II  Anesthesia Plan: General   Post-op Pain Management:    Induction: Intravenous  PONV Risk Score and Plan:   Airway Management Planned: Oral ETT  Additional Equipment:   Intra-op Plan:   Post-operative Plan: Extubation in OR  Informed Consent: I have reviewed the patients History and Physical, chart, labs and discussed the procedure including the risks, benefits and alternatives for the proposed anesthesia with the patient or authorized representative who has indicated his/her understanding and acceptance.     Plan Discussed with: CRNA and Surgeon  Anesthesia Plan Comments:         Anesthesia Quick Evaluation

## 2017-04-30 NOTE — Transfer of Care (Signed)
Immediate Anesthesia Transfer of Care Note  Patient: Lorraine Lee  Procedure(s) Performed: Procedure(s): LAPAROSCOPIC CONVERTED TO OPEN CHOLECYSTECTOMY WITH INTRAOPERATIVE CHOLANGIOGRAM (N/A) HERNIA REPAIR UMBILICAL ADULT (N/A)  Patient Location: PACU  Anesthesia Type:General  Level of Consciousness: sedated  Airway & Oxygen Therapy: Patient Spontanous Breathing and Patient connected to face mask oxygen  Post-op Assessment: Report given to RN and Post -op Vital signs reviewed and stable  Post vital signs: Reviewed and stable  Last Vitals:  Vitals:   04/30/17 0904 04/30/17 1338  BP: 110/74 107/63  Pulse: 71 78  Resp: 15 (!) 21  Temp: (!) 35.8 C 36.7 C  SpO2: 68% 08%    Complications: No apparent anesthesia complications

## 2017-04-30 NOTE — OR Nursing (Signed)
Patient complains of being constipated today.  Instructed her to start the stool softeners when she gets home and along with any narcotics.

## 2017-04-30 NOTE — Anesthesia Post-op Follow-up Note (Signed)
Anesthesia QCDR form completed.        

## 2017-04-30 NOTE — Progress Notes (Signed)
Husband at bedside.  

## 2017-04-30 NOTE — Interval H&P Note (Signed)
History and Physical Interval Note:  04/30/2017 9:35 AM  Lorraine Lee  has presented today for surgery, with the diagnosis of GALLSTONES,UMBILICAL HERNIA  The various methods of treatment have been discussed with the patient and family. After consideration of risks, benefits and other options for treatment, the patient has consented to  Procedure(s): LAPAROSCOPIC CHOLECYSTECTOMY WITH INTRAOPERATIVE CHOLANGIOGRAM (N/A) HERNIA REPAIR UMBILICAL ADULT (N/A) as a surgical intervention .  The patient's history has been reviewed, patient examined, no change in status, stable for surgery.  I have reviewed the patient's chart and labs.  Questions were answered to the patient's satisfaction.     SANKAR,SEEPLAPUTHUR G

## 2017-04-30 NOTE — Progress Notes (Signed)
Called re: poor pan control and modest hypotension. Baseline BP 409 systolic in office. BP 811 systolic during procedure. Received 1 liter IV fluids. No history heart or renal failure. Will increase IV fluids tonight. Add IV tylenol and toradol for pain control to avoid narcotics.

## 2017-04-30 NOTE — Progress Notes (Signed)
Dr. Kayleen Memos aware of 96/60 bp, no further orders. Treating pain and monitoring vitals.

## 2017-04-30 NOTE — Anesthesia Procedure Notes (Signed)
Procedure Name: Intubation Date/Time: 04/30/2017 10:34 AM Performed by: Doreen Salvage, CRNA Pre-anesthesia Checklist: Patient identified, Patient being monitored, Timeout performed, Emergency Drugs available and Suction available Patient Re-evaluated:Patient Re-evaluated prior to induction Oxygen Delivery Method: Circle system utilized Preoxygenation: Pre-oxygenation with 100% oxygen Induction Type: IV induction Ventilation: Mask ventilation without difficulty Laryngoscope Size: Mac and 3 Grade View: Grade I Tube type: Oral Tube size: 7.0 mm Number of attempts: 1 Airway Equipment and Method: Stylet Placement Confirmation: ETT inserted through vocal cords under direct vision,  positive ETCO2 and breath sounds checked- equal and bilateral Secured at: 21 cm Tube secured with: Tape Dental Injury: Teeth and Oropharynx as per pre-operative assessment

## 2017-04-30 NOTE — Progress Notes (Signed)
Patient given ice chips. 

## 2017-04-30 NOTE — Op Note (Signed)
Preop diagnosis: Cholelithiasis with chronic cholecystitis.  Umbilical incisional hernia  Post op diagnosis: Same as preop plus extensive abdominal adhesions.  Operation: Open cholecystectomy and repair of umbilical/incisional hernia  Surgeon: Mckinley Jewel  Assistant:     Anesthesia: General  Complications: None  EBL: Minimal  Drains: None  Description: This patient had previously undergone an open colon resection for obstructing carcinoma along with an ileostomy which was subsequently reversed.  The patient was brought in this time because of symptomatic cholelithiasis.  She also had a 3-4 cm hernia located above the umbilicus of the upper end of her previous midline incision.  Plan was for repair of the umbilical hernia in association with laparoscopic cholecystectomy. Patient was brought to the operating room and put to sleep with an endotracheal tube.  Abdomen was prepped and draped sterile field timeout performed.  The umbilical hernia was operated on first.  A curved incision along the uppermost part of the umbilicus extending out transversely on either side was made and carefully the hernial sac was identified in the subcutaneous tissue and closely adherent to the skin of the umbilical area.  Further dissection was carried out to expose this sac completely and opened to reveal that there were multiple adhesions not only omentum but also reports portions of small bowel to the anterior abdominal wall.  The fascial defect measured approximately 4 cm in the transverse orientation.  There appeared to be no viable peritoneal space and on lifting the upper edge of the fascia and palpating and with trying to visualized there appeared to be no opening within the peritoneal cavity allow for safe laparoscopic procedure.  This point was felt safer to repair the hernia and to do an open cholecystectomy.  By telephone this was discussed with the patient's husband who was agreeable.  The peritoneal sac  was excised flush with the fascial edge.  And that the edges were freed it was approximated with interrupted figure-of-eight stitches of 0 Prolene to close the defect.  Mesh was not used in view of the potential contamination from bile.  After the fascia was approximated the wound was irrigated subcutaneous tissue closed with 3-0 Vicryl.  Skin was closed subcuticular 4-0 Vicryl. A right subcostal incision was then made and carefully deepened through the layers of the abdominal wall.  The rectus fascia and the muscle were cut with cautery the posterior sheath and peritoneum were opened and extended laterally.  Was again evident that the omentum was encasing around the gallbladder which took a little bit of lysis to finally identify the gallbladder.  The omentum was also adherent initially to the peritoneum above the liver and this had to be freed also to define the liver H and the subhepatic space.  After satisfactory exposure was obtained with [place] Silos retractors placed the gallbladder was pulled up and dissected to show the common bile duct the cystic duct and cystic artery.  Artery was hemoclipped and cut silk tie was placed proximally on the cystic duct and small opening was made.  A Reddick catheter was then positioned in the cystic duct and cholangiogram was performed.  There was good filling of the bile duct both proximal and distal with prompt emptying into the duodenum and no apparent retained stones.  Following this the cystic duct was hemoclipped and cut.  A second branch of the cystic artery go posteriorly was identified with separately hemoclipped and cauterized.  The gallbladder was then dissected free from its bed using cautery for  control of bleeding.  It contains at least 2 cm centimeters stones measuring 1-1.5 cm.  The right upper quadrant was irrigated all fluid was suctioned out after ensuring hemostasis and security of the clips the abdomen was closed.  The peritoneal layer was closed with a  running 0 Vicryl.  The internal oblique muscle laterally was closed with running 2-0 Vicryl.  The anterior fascia and the external oblique laterally closed with interrupted figure-of-eight stitch of 0 Maxon.  Wound was irrigated in layers on the way out.  Subcutaneous tissue was approximated with a running 2-0 Vicryl.  Skin was closed in a running subcuticular stitch of 3-0 Monocryl.  Both incisions were covered with Dermabond and a honeycomb dressing was also placed over these.  There were no problems encountered during the procedure and the patient subsequently was extubated and returned to recovery room in stable condition

## 2017-04-30 NOTE — Anesthesia Postprocedure Evaluation (Signed)
Anesthesia Post Note  Patient: Product/process development scientist  Procedure(s) Performed: LAPAROSCOPIC CONVERTED TO OPEN CHOLECYSTECTOMY WITH INTRAOPERATIVE CHOLANGIOGRAM (N/A ) HERNIA REPAIR UMBILICAL ADULT (N/A )  Patient location during evaluation: PACU Anesthesia Type: General Level of consciousness: awake and alert and oriented Pain management: pain level controlled Vital Signs Assessment: post-procedure vital signs reviewed and stable Respiratory status: spontaneous breathing Cardiovascular status: blood pressure returned to baseline Anesthetic complications: no     Last Vitals:  Vitals:   04/30/17 1432 04/30/17 1437  BP:    Pulse: 83 78  Resp: 19 (!) 9  Temp:    SpO2: 95% 94%    Last Pain:  Vitals:   04/30/17 1437  TempSrc:   PainSc: 5                  Dayvion Sans

## 2017-04-30 NOTE — Progress Notes (Signed)
Per Dr. Jamal Collin okay for RN to advance diet to full liquids.

## 2017-05-01 ENCOUNTER — Encounter: Payer: Self-pay | Admitting: General Surgery

## 2017-05-01 DIAGNOSIS — K432 Incisional hernia without obstruction or gangrene: Secondary | ICD-10-CM | POA: Diagnosis not present

## 2017-05-01 DIAGNOSIS — Z9049 Acquired absence of other specified parts of digestive tract: Secondary | ICD-10-CM | POA: Diagnosis not present

## 2017-05-01 DIAGNOSIS — K219 Gastro-esophageal reflux disease without esophagitis: Secondary | ICD-10-CM | POA: Diagnosis not present

## 2017-05-01 DIAGNOSIS — K8010 Calculus of gallbladder with chronic cholecystitis without obstruction: Secondary | ICD-10-CM | POA: Diagnosis not present

## 2017-05-01 DIAGNOSIS — I10 Essential (primary) hypertension: Secondary | ICD-10-CM | POA: Diagnosis not present

## 2017-05-01 DIAGNOSIS — Z23 Encounter for immunization: Secondary | ICD-10-CM | POA: Diagnosis not present

## 2017-05-01 DIAGNOSIS — R1013 Epigastric pain: Secondary | ICD-10-CM | POA: Diagnosis not present

## 2017-05-01 DIAGNOSIS — K660 Peritoneal adhesions (postprocedural) (postinfection): Secondary | ICD-10-CM | POA: Diagnosis not present

## 2017-05-01 DIAGNOSIS — E119 Type 2 diabetes mellitus without complications: Secondary | ICD-10-CM | POA: Diagnosis not present

## 2017-05-01 LAB — CBC
HCT: 33.8 % — ABNORMAL LOW (ref 35.0–47.0)
Hemoglobin: 11.6 g/dL — ABNORMAL LOW (ref 12.0–16.0)
MCH: 31.5 pg (ref 26.0–34.0)
MCHC: 34.4 g/dL (ref 32.0–36.0)
MCV: 91.7 fL (ref 80.0–100.0)
PLATELETS: 263 10*3/uL (ref 150–440)
RBC: 3.69 MIL/uL — AB (ref 3.80–5.20)
RDW: 13.4 % (ref 11.5–14.5)
WBC: 10.1 10*3/uL (ref 3.6–11.0)

## 2017-05-01 LAB — COMPREHENSIVE METABOLIC PANEL
ALBUMIN: 2.9 g/dL — AB (ref 3.5–5.0)
ALT: 26 U/L (ref 14–54)
AST: 30 U/L (ref 15–41)
Alkaline Phosphatase: 41 U/L (ref 38–126)
Anion gap: 5 (ref 5–15)
BUN: 12 mg/dL (ref 6–20)
CHLORIDE: 109 mmol/L (ref 101–111)
CO2: 23 mmol/L (ref 22–32)
CREATININE: 0.73 mg/dL (ref 0.44–1.00)
Calcium: 8.1 mg/dL — ABNORMAL LOW (ref 8.9–10.3)
Glucose, Bld: 172 mg/dL — ABNORMAL HIGH (ref 65–99)
POTASSIUM: 3.6 mmol/L (ref 3.5–5.1)
SODIUM: 137 mmol/L (ref 135–145)
Total Bilirubin: 0.2 mg/dL — ABNORMAL LOW (ref 0.3–1.2)
Total Protein: 5.7 g/dL — ABNORMAL LOW (ref 6.5–8.1)

## 2017-05-01 LAB — SURGICAL PATHOLOGY

## 2017-05-01 MED ORDER — MAGNESIUM HYDROXIDE 400 MG/5ML PO SUSP
30.0000 mL | Freq: Every day | ORAL | Status: DC | PRN
  Administered 2017-05-01 – 2017-05-02 (×2): 30 mL via ORAL
  Filled 2017-05-01 (×3): qty 30

## 2017-05-01 MED ORDER — NON FORMULARY: 2.0000 mg | Freq: Every evening | Status: DC | PRN

## 2017-05-01 MED ORDER — MELATONIN 5 MG PO TABS
2.5000 mg | ORAL_TABLET | Freq: Every evening | ORAL | Status: DC | PRN
  Administered 2017-05-01: 2.5 mg via ORAL
  Filled 2017-05-01 (×2): qty 0.5

## 2017-05-01 MED ORDER — ACETAMINOPHEN 325 MG PO TABS
650.0000 mg | ORAL_TABLET | Freq: Four times a day (QID) | ORAL | Status: DC | PRN
  Administered 2017-05-01: 650 mg via ORAL
  Filled 2017-05-01 (×2): qty 2

## 2017-05-01 NOTE — Progress Notes (Signed)
Per Dr. Jamal Collin okay for RN to advance diet to carb modified diet.

## 2017-05-01 NOTE — Progress Notes (Signed)
Patient ID: Lorraine Lee, female   DOB: [birth year], 66 y.o.   MRN: [id number] Pt had problem with pain control last night, but much better today. Also BP was in 90s syst, -BP med held. She has been up and walked few times. No n/v. Abdomen is soft, good bowel sounds Incisions are clean and intact Labs ok. Will monitor today to ensure BP stablizes. Advance diet . Likely home tomorrow.

## 2017-05-01 NOTE — Progress Notes (Signed)
Per Dr. Jamal Collin okay for RN to place order for PO tylenol.

## 2017-05-01 NOTE — Progress Notes (Signed)
   Nutrition Brief Note  Patient identified on the Malnutrition Screening Tool (MST) Report  Wt Readings from Last 15 Encounters:  04/30/17 193 lb (87.5 kg)  04/17/17 193 lb (87.5 kg)  06/29/14 203 lb (92.1 kg)  03/17/14 206 lb (93.4 kg)  05/20/13 207 lb (93.9 kg)  05/08/13 207 lb 3.2 oz (94 kg)    Ms. Ledo has a PMH of  Diverticulosis, anemia, HTN, GERD, Colon CA presents with epigastric pain, vomiting after meals.  Now s/p cholecystectomy and repair of umbilical/incisional hernia.  She reports good appetite PTA, no weight loss, PO intake unchanged. Does report some gas and bloating but otherwise was fine. Continues to have gas today. Concerned about eating salad and potatoes, does not want more gas, but otherwise fine.  Meal Completion: 90%  Labs reviewed:  CBGs 183, 126  Medications reviewed and include: Vitamin D, MVI w/ Mineral 1/2 NS at 186mL/hr  Body mass index is 32.12 kg/m. Patient meets criteria for obese class I based on current BMI.   No nutrition interventions warranted at this time. If nutrition issues arise, please consult RD.   Satira Anis. Orvis Stann, MS, RD LDN Inpatient Clinical Dietitian Pager [phone]

## 2017-05-01 NOTE — Progress Notes (Signed)
Notified Dr. Bary Castilla about patient pain control and BP of 94/58. New orders put in at this time.

## 2017-05-01 NOTE — Progress Notes (Signed)
Notified Dr. Bary Castilla about patient BP of 98/58. Okay to hold BP med.

## 2017-05-02 DIAGNOSIS — Z23 Encounter for immunization: Secondary | ICD-10-CM | POA: Diagnosis not present

## 2017-05-02 DIAGNOSIS — K219 Gastro-esophageal reflux disease without esophagitis: Secondary | ICD-10-CM | POA: Diagnosis not present

## 2017-05-02 DIAGNOSIS — R1013 Epigastric pain: Secondary | ICD-10-CM | POA: Diagnosis not present

## 2017-05-02 DIAGNOSIS — E119 Type 2 diabetes mellitus without complications: Secondary | ICD-10-CM | POA: Diagnosis not present

## 2017-05-02 DIAGNOSIS — I10 Essential (primary) hypertension: Secondary | ICD-10-CM | POA: Diagnosis not present

## 2017-05-02 DIAGNOSIS — K432 Incisional hernia without obstruction or gangrene: Secondary | ICD-10-CM | POA: Diagnosis not present

## 2017-05-02 DIAGNOSIS — Z9049 Acquired absence of other specified parts of digestive tract: Secondary | ICD-10-CM | POA: Diagnosis not present

## 2017-05-02 DIAGNOSIS — K660 Peritoneal adhesions (postprocedural) (postinfection): Secondary | ICD-10-CM | POA: Diagnosis not present

## 2017-05-02 DIAGNOSIS — K8010 Calculus of gallbladder with chronic cholecystitis without obstruction: Secondary | ICD-10-CM | POA: Diagnosis not present

## 2017-05-02 MED ORDER — OXYCODONE HCL 5 MG PO TABS: 5.0000 mg | ORAL_TABLET | ORAL | 0 refills | Status: AC | PRN

## 2017-05-02 NOTE — Discharge Summary (Signed)
Physician Discharge Summary  Patient ID: Lorraine Lee MRN: [id number] DOB/AGE: 1951-02-16 66 y.o.  Admit date: 04/30/2017 Discharge date: 05/02/2017  Admission Diagnoses: Cholelithiasis with cholecystitis.  Umbilical//incisional hernia Discharge Diagnoses: Same plus extensive abdominal adhesions Active Problems:   Gallstones   Discharged Condition: good  Hospital Course: This 66 year old female with previous history of colon cancer necessitating urgent resection and ileostomy with subsequent closure presented with symptoms consistent with cholelithiasis and chronic cholecystitis.  Lab values did not show any evidence of common duct involvement.  Ultrasound confirmed gallstones.  She also had a sizable hernia just above the umbilicus from the upper end of her previous midline incision.  After discussion it was agreed to proceed with attempted laparoscopic cholecystectomy and repair of the hernia at the same time. At surgery the umbilical hernia turned out to be slightly larger and the fascial defect to about 4 cm with a significant amount of adhesions of the to the anterior abdominal wall.  Given the paucity of an adequate peritoneal space laparoscopy was not feasible.  Accordingly decision made to repair the hernia with an open cholecystectomy.  Patient then had the repair performed along with an open cholecystectomy and intraoperative cholangiogram, cholangiogram was normal.  Postoperative course was basically uncomplicated she was kept on observation for 48 hours and with some improvement in control of her pain patient was able to tolerate oral intake and is now being discharged to be followed as an outpatient.  First postop day she had a mildly low blood pressure was 102 systolic which responded to some IV fluids and then stabilized/postop labs were also diverted to be okay Consults: None  Significant Diagnostic Studies: None  Treatments: surgery: Open cholecystectomy with intraoperative  cholangiogram and repair of umbilical/ventral hernia Discharge Exam: Blood pressure 109/63, pulse 78, temperature 98.8 F (37.1 C), temperature source Oral, resp. rate 18, height 5\' 5"  (1.651 m), weight 193 lb (87.5 kg), SpO2 92 %. GI: Abdomen is soft incision of the subcostal and umbilical area appears to be clean and intact.  Mild tenderness along the incision but otherwise the abdomen is soft with good bowel sounds.  Vital signs are stable  Disposition:   Discharge Instructions    Call MD for:  persistant nausea and vomiting   Complete by:  As directed    Call MD for:  redness, tenderness, or signs of infection (pain, swelling, redness, odor or green/yellow discharge around incision site)   Complete by:  As directed    Call MD for:  severe uncontrolled pain   Complete by:  As directed    Call MD for:  temperature >100.4   Complete by:  As directed    Diet - low sodium heart healthy   Complete by:  As directed    Discharge instructions   Complete by:  As directed    No exertional activity May shower Use Tylenol 4 times a day for the next 2-3 days to minimize pain and to minimize use of narcotic pain medicine MiraLAX  l daily until her bowels returned to normal     Allergies as of 05/02/2017      Reactions   Sulfonamide Derivatives    REACTION: rash   Tetanus Toxoid    REACTION: fever and swelling of site      Medication List    TAKE these medications   calcium carbonate 500 MG chewable tablet Commonly known as:  TUMS - dosed in mg elemental calcium Chew 1 tablet by mouth as needed for  indigestion or heartburn.   losartan 25 MG tablet Commonly known as:  COZAAR Take 25 mg by mouth every morning.   metFORMIN 500 MG tablet Commonly known as:  GLUCOPHAGE Take by mouth daily with breakfast.   multivitamin tablet Take 1 tablet by mouth daily.   omeprazole 40 MG capsule Commonly known as:  PRILOSEC Take 1 capsule (40 mg total) by mouth daily.   oxyCODONE 5 MG  immediate release tablet Commonly known as:  ROXICODONE Take 1 tablet (5 mg total) by mouth every 4 (four) hours as needed.   rosuvastatin 20 MG tablet Commonly known as:  CRESTOR Take 20 mg by mouth every morning.   TRILIPIX 135 MG capsule Generic drug:  Choline Fenofibrate Take 135 mg by mouth every morning.   venlafaxine XR 150 MG 24 hr capsule Commonly known as:  EFFEXOR-XR Take 150 mg by mouth daily with breakfast.   VITAMIN D PO Take 5,000 Units by mouth daily.      Follow-up Information    Christene Lye, MD Follow up in 1 week(s).   Specialties:  General Surgery, Radiology Contact information: [address] [place] [address] [phone]           Signed: Christene Lye 05/02/2017, 11:29 AM

## 2017-05-02 NOTE — Progress Notes (Signed)
Pt had bleeding on removal of IV. Pressure was applied and pt's bleeding ceased. Pt was monitored and she wanted to lay down. I stayed in the room to monitor pt for about 30 minutes. I will let pt rest for awhile. I will continue to monitor. Daughter is in the room and she will notify me if pt need anything.

## 2017-05-09 ENCOUNTER — Encounter: Payer: Self-pay | Admitting: General Surgery

## 2017-05-09 ENCOUNTER — Ambulatory Visit (INDEPENDENT_AMBULATORY_CARE_PROVIDER_SITE_OTHER): Payer: 59 | Admitting: General Surgery

## 2017-05-09 VITALS — BP 124/76 | HR 72 | Resp 12 | Ht 65.0 in | Wt 191.0 lb

## 2017-05-09 DIAGNOSIS — K429 Umbilical hernia without obstruction or gangrene: Secondary | ICD-10-CM

## 2017-05-09 DIAGNOSIS — K8020 Calculus of gallbladder without cholecystitis without obstruction: Secondary | ICD-10-CM

## 2017-05-09 NOTE — Progress Notes (Signed)
Patient ID: Lorraine Lee, female   DOB: 12/05/1950, 66 y.o.   MRN: [id number]  Chief Complaint  Patient presents with  . Routine Post Op    HPI Lorraine Lee is a 66 y.o. female here today for her one week post op gallbladder surgery done on 04/30/2017. Patient states she is doing well.  Still with some pain and soreness of the incision under the rib cage.  She is eating well otherwise with no nausea or vomiting.  Denies any fever but has felt a little cold at times. HPI  Past Medical History:  Diagnosis Date  . Anemia   . Colon cancer ([place]) 2006   chemo tx   . Diabetes mellitus without complication ([place])   . Diverticulosis   . Gall stone   . GERD (gastroesophageal reflux disease)    h/o  . Hypertension   . Hypothyroidism    h/o no meds currently  . Internal hemorrhoids without mention of complication   . Personal history of malignant neoplasm of large intestine     Past Surgical History:  Procedure Laterality Date  . CESAREAN SECTION     x4  . CHOLECYSTECTOMY N/A 04/30/2017   Procedure: LAPAROSCOPIC CONVERTED TO OPEN CHOLECYSTECTOMY WITH INTRAOPERATIVE CHOLANGIOGRAM;  Surgeon: Christene Lye, MD;  Location: [facility];  Service: General;  Laterality: N/A;  . COLON RESECTION  2005  . COLONOSCOPY WITH ESOPHAGOGASTRODUODENOSCOPY (EGD)  2015  . ILEOSTOMY CLOSURE    . KNEE ARTHROSCOPY WITH MEDIAL MENISECTOMY Right 03/17/2014   Procedure: RIGHT ARTHROSCOPY KNEE,MEDIAL MENISECTOMY AND DEBRIDEMENT CHONDROMALACIA, ;  Surgeon: Kerin Salen, MD;  Location: [place];  Service: Orthopedics;  Laterality: Right;  . UMBILICAL HERNIA REPAIR N/A 04/30/2017   Procedure: HERNIA REPAIR UMBILICAL ADULT;  Surgeon: Christene Lye, MD;  Location: [facility];  Service: General;  Laterality: N/A;    Family History  Problem Relation Age of Onset  . Diabetes Father   . Heart disease Father   . Colon cancer Maternal Uncle     Social History Social History   Tobacco  Use  . Smoking status: Never Smoker  . Smokeless tobacco: Never Used  Substance Use Topics  . Alcohol use: No  . Drug use: No    Allergies  Allergen Reactions  . Sulfonamide Derivatives     REACTION: rash  . Tetanus Toxoid     REACTION: fever and swelling of site    Current Outpatient Medications  Medication Sig Dispense Refill  . calcium carbonate (TUMS - DOSED IN MG ELEMENTAL CALCIUM) 500 MG chewable tablet Chew 1 tablet by mouth as needed for indigestion or heartburn.    . Cholecalciferol (VITAMIN D PO) Take 5,000 Units by mouth daily.    . Choline Fenofibrate (TRILIPIX) 135 MG capsule Take 135 mg by mouth every morning.     Marland Kitchen losartan (COZAAR) 25 MG tablet Take 25 mg by mouth every morning.    . metFORMIN (GLUCOPHAGE) 500 MG tablet Take by mouth daily with breakfast.    . Multiple Vitamin (MULTIVITAMIN) tablet Take 1 tablet by mouth daily.    Marland Kitchen omeprazole (PRILOSEC) 40 MG capsule Take 1 capsule (40 mg total) by mouth daily. 30 capsule 1  . oxyCODONE (ROXICODONE) 5 MG immediate release tablet Take 1 tablet (5 mg total) by mouth every 4 (four) hours as needed. 30 tablet 0  . rosuvastatin (CRESTOR) 20 MG tablet Take 20 mg by mouth every morning.     . venlafaxine XR (EFFEXOR-XR) 150 MG  24 hr capsule Take 150 mg by mouth daily with breakfast.     No current facility-administered medications for this visit.     Review of Systems Review of Systems  Constitutional: Negative.   Respiratory: Negative.   Cardiovascular: Negative.     Blood pressure 124/76, pulse 72, resp. rate 12, height 5\' 5"  (1.651 m), weight 191 lb (86.6 kg).  Physical Exam Physical Exam  Constitutional: She is oriented to person, place, and time. She appears well-developed and well-nourished.  Cardiovascular: Normal rate, regular rhythm and normal heart sounds.  Pulmonary/Chest: Effort normal and breath sounds normal.  Abdominal: Soft. Bowel sounds are normal. There is tenderness ( Mild tenderness of the  incision sites as expected).    Neurological: She is alert and oriented to person, place, and time.  Skin: Skin is warm and dry.    Data Reviewed Notes and op note reviewed   Assessment    Postop cholecystectomy with intraoperative cholangiogram and repair of large umbilical hernia. Overall she is doing well.  She has a planned trip to [place] leaving on the 22nd of this month.  As long as she is doing well I feel it is okay for her to travel.    Plan   Requested a CBC and liver function Patient to return in one week.The patient is aware to call back for any questions or concerns.   HPI, Physical Exam, Assessment and Plan have been scribed under the direction and in the presence of Mckinley Jewel, MD  Gaspar Cola, CMA    I have completed the exam and reviewed the above documentation for accuracy and completeness.  I agree with the above.  Haematologist has been used and any errors in dictation or transcription are unintentional.  Mikeisha Lemonds G. Jamal Collin, M.D., F.A.C.S.      Junie Panning G 05/13/2017, 8:59 AM

## 2017-05-16 ENCOUNTER — Ambulatory Visit (INDEPENDENT_AMBULATORY_CARE_PROVIDER_SITE_OTHER): Payer: 59 | Admitting: General Surgery

## 2017-05-16 ENCOUNTER — Encounter: Payer: Self-pay | Admitting: General Surgery

## 2017-05-16 ENCOUNTER — Telehealth: Payer: Self-pay | Admitting: *Deleted

## 2017-05-16 VITALS — BP 138/74 | HR 72 | Resp 14 | Ht 67.0 in | Wt 195.0 lb

## 2017-05-16 DIAGNOSIS — K8020 Calculus of gallbladder without cholecystitis without obstruction: Secondary | ICD-10-CM

## 2017-05-16 DIAGNOSIS — K429 Umbilical hernia without obstruction or gangrene: Secondary | ICD-10-CM

## 2017-05-16 NOTE — Telephone Encounter (Signed)
Patient was contacted on her cell phone today and notified that recent labs were normal. She verbalizes understanding.

## 2017-05-16 NOTE — Patient Instructions (Signed)
Patient to return as needed. The patient is aware to call back for any questions or concerns. 

## 2017-05-16 NOTE — Progress Notes (Signed)
Patient ID: Lorraine Lee, female   DOB: 1950-11-16, 66 y.o.   MRN: [id number]  Chief Complaint  Patient presents with  . Follow-up    HPI Lorraine Lee is a 66 y.o. female.  Here for follow up post op gallbladder surgery done on 04/30/2017.Patient states she is doing better.   HPI  Past Medical History:  Diagnosis Date  . Anemia   . Colon cancer ([place]) 2006   chemo tx   . Diabetes mellitus without complication ([place])   . Diverticulosis   . Gall stone   . GERD (gastroesophageal reflux disease)    h/o  . Hypertension   . Hypothyroidism    h/o no meds currently  . Internal hemorrhoids without mention of complication   . Personal history of malignant neoplasm of large intestine     Past Surgical History:  Procedure Laterality Date  . CESAREAN SECTION     x4  . CHOLECYSTECTOMY N/A 04/30/2017   Procedure: LAPAROSCOPIC CONVERTED TO OPEN CHOLECYSTECTOMY WITH INTRAOPERATIVE CHOLANGIOGRAM;  Surgeon: Christene Lye, MD;  Location: [facility];  Service: General;  Laterality: N/A;  . COLON RESECTION  2005  . COLONOSCOPY WITH ESOPHAGOGASTRODUODENOSCOPY (EGD)  2015  . ILEOSTOMY CLOSURE    . KNEE ARTHROSCOPY WITH MEDIAL MENISECTOMY Right 03/17/2014   Procedure: RIGHT ARTHROSCOPY KNEE,MEDIAL MENISECTOMY AND DEBRIDEMENT CHONDROMALACIA, ;  Surgeon: Kerin Salen, MD;  Location: [facility];  Service: Orthopedics;  Laterality: Right;  . UMBILICAL HERNIA REPAIR N/A 04/30/2017   Procedure: HERNIA REPAIR UMBILICAL ADULT;  Surgeon: Christene Lye, MD;  Location: [facility];  Service: General;  Laterality: N/A;    Family History  Problem Relation Age of Onset  . Diabetes Father   . Heart disease Father   . Colon cancer Maternal Uncle     Social History Social History   Tobacco Use  . Smoking status: Never Smoker  . Smokeless tobacco: Never Used  Substance Use Topics  . Alcohol use: No  . Drug use: No    Allergies  Allergen Reactions  . Sulfonamide  Derivatives     REACTION: rash  . Tetanus Toxoid     REACTION: fever and swelling of site    Current Outpatient Medications  Medication Sig Dispense Refill  . calcium carbonate (TUMS - DOSED IN MG ELEMENTAL CALCIUM) 500 MG chewable tablet Chew 1 tablet by mouth as needed for indigestion or heartburn.    . Cholecalciferol (VITAMIN D PO) Take 5,000 Units by mouth daily.    . Choline Fenofibrate (TRILIPIX) 135 MG capsule Take 135 mg by mouth every morning.     Marland Kitchen losartan (COZAAR) 25 MG tablet Take 25 mg by mouth every morning.    . metFORMIN (GLUCOPHAGE) 500 MG tablet Take by mouth daily with breakfast.    . Multiple Vitamin (MULTIVITAMIN) tablet Take 1 tablet by mouth daily.    Marland Kitchen omeprazole (PRILOSEC) 40 MG capsule Take 1 capsule (40 mg total) by mouth daily. 30 capsule 1  . oxyCODONE (ROXICODONE) 5 MG immediate release tablet Take 1 tablet (5 mg total) by mouth every 4 (four) hours as needed. 30 tablet 0  . rosuvastatin (CRESTOR) 20 MG tablet Take 20 mg by mouth every morning.     . venlafaxine XR (EFFEXOR-XR) 150 MG 24 hr capsule Take 150 mg by mouth daily with breakfast.     No current facility-administered medications for this visit.     Review of Systems Review of Systems  Constitutional: Negative.   Respiratory: Negative.  Cardiovascular: Negative.     Blood pressure 138/74, pulse 72, resp. rate 14, height 5\' 7"  (1.702 m), weight 195 lb (88.5 kg).  Physical Exam Physical Exam  Constitutional: She is oriented to person, place, and time. She appears well-developed and well-nourished.  Patient appears to be much more comfortable  Eyes: Conjunctivae are normal. No scleral icterus.  Cardiovascular: Normal rate and regular rhythm.  Pulmonary/Chest: Effort normal and breath sounds normal.  Abdominal: Soft. Normal appearance and bowel sounds are normal. There is no tenderness.    Neurological: She is alert and oriented to person, place, and time.  Skin: Skin is warm and dry.     Data Reviewed Prior notes reviewed  Labs were repeated by Dr. Rebecka Apley and showed a normal CBC and liver functions Assessment    Postop cholecystectomy with intraoperative cholangiogram and repair of large umbilical hernia. Patient has made good progress and even though she is still having some discomfort in the subcostal area this will continue to improve.  Patient was reassured     Plan    Patient to return as needed.The patient is aware to call back for any questions or concerns.      HPI, Physical Exam, Assessment and Plan have been scribed under the direction and in the presence of Mckinley Jewel, MD  Gaspar Cola, CMA I have completed the exam and reviewed the above documentation for accuracy and completeness.  I agree with the above.  Haematologist has been used and any errors in dictation or transcription are unintentional.  Seeplaputhur G. Jamal Collin, M.D., F.A.C.S.   Junie Panning G 05/17/2017, 10:03 AM

## 2017-11-19 DIAGNOSIS — H8111 Benign paroxysmal vertigo, right ear: Secondary | ICD-10-CM | POA: Insufficient documentation

## 2017-12-03 ENCOUNTER — Other Ambulatory Visit: Payer: Self-pay | Admitting: Neurology

## 2017-12-03 DIAGNOSIS — R42 Dizziness and giddiness: Secondary | ICD-10-CM

## 2017-12-17 ENCOUNTER — Ambulatory Visit: Payer: Medicare PPO

## 2017-12-27 ENCOUNTER — Ambulatory Visit
Admission: RE | Admit: 2017-12-27 | Discharge: 2017-12-27 | Disposition: A | Discharge status: Home / Self Care | Payer: Medicare PPO | Source: Ambulatory Visit | Attending: Neurology | Admitting: Neurology

## 2017-12-27 DIAGNOSIS — R42 Dizziness and giddiness: Secondary | ICD-10-CM | POA: Diagnosis not present

## 2017-12-27 LAB — POCT I-STAT CREATININE: Creatinine, Ser: 0.7 mg/dL (ref 0.44–1.00)

## 2017-12-27 MED ORDER — GADOBENATE DIMEGLUMINE 529 MG/ML IV SOLN
20.0000 mL | Freq: Once | INTRAVENOUS | Status: AC | PRN
  Administered 2017-12-27: 18 mL via INTRAVENOUS

## 2018-03-31 ENCOUNTER — Other Ambulatory Visit: Payer: Self-pay

## 2018-03-31 DIAGNOSIS — Z85038 Personal history of other malignant neoplasm of large intestine: Secondary | ICD-10-CM

## 2018-04-22 ENCOUNTER — Telehealth: Payer: Self-pay

## 2018-04-22 NOTE — Telephone Encounter (Signed)
ERROR

## 2018-04-22 NOTE — Telephone Encounter (Signed)
Call transferred to [facility]

## 2018-04-22 NOTE — Telephone Encounter (Signed)
Trish contacted patient to provide colonoscopy time to her for procedure.  Trish transferred the call to nursing staff because patient stated she was not aware of her colonoscopy instructions.  Instructions were sent to her.  She states her husband Dr. Lavera Guise stopped by the office and she has her bowel prep and plans on doing it as instructed but did not receive any instructions.  She stated that she ate oatmeal this am.  I explained to her that she is supposed to be on a clear liquid diet and oatmeal is not allowed.  She said she is quite familiar with the colonoscopy procedure and does not wish to reschedule.  I also explained to her that if she is not completely clean she will need to repeat the colonoscopy. She expressed understanding of this and will remain on the schedule for you tomorrow.  Thanks  [employer]

## 2018-04-23 ENCOUNTER — Ambulatory Visit
Admission: RE | Admit: 2018-04-23 | Discharge: 2018-04-23 | Disposition: A | Discharge status: Home / Self Care | Payer: Medicare PPO | Source: Ambulatory Visit | Attending: Gastroenterology | Admitting: Gastroenterology

## 2018-04-23 ENCOUNTER — Ambulatory Visit: Payer: Medicare PPO | Admitting: Anesthesiology

## 2018-04-23 ENCOUNTER — Encounter
Admission: RE | Disposition: A | Discharge status: Home / Self Care | Payer: Self-pay | Source: Ambulatory Visit | Attending: Gastroenterology

## 2018-04-23 DIAGNOSIS — Z85038 Personal history of other malignant neoplasm of large intestine: Secondary | ICD-10-CM

## 2018-04-23 DIAGNOSIS — D122 Benign neoplasm of ascending colon: Secondary | ICD-10-CM

## 2018-04-23 DIAGNOSIS — Z79899 Other long term (current) drug therapy: Secondary | ICD-10-CM | POA: Diagnosis not present

## 2018-04-23 DIAGNOSIS — K219 Gastro-esophageal reflux disease without esophagitis: Secondary | ICD-10-CM | POA: Diagnosis not present

## 2018-04-23 DIAGNOSIS — Z7984 Long term (current) use of oral hypoglycemic drugs: Secondary | ICD-10-CM | POA: Insufficient documentation

## 2018-04-23 DIAGNOSIS — Z980 Intestinal bypass and anastomosis status: Secondary | ICD-10-CM

## 2018-04-23 DIAGNOSIS — D120 Benign neoplasm of cecum: Secondary | ICD-10-CM

## 2018-04-23 DIAGNOSIS — D125 Benign neoplasm of sigmoid colon: Secondary | ICD-10-CM | POA: Insufficient documentation

## 2018-04-23 DIAGNOSIS — K635 Polyp of colon: Secondary | ICD-10-CM

## 2018-04-23 DIAGNOSIS — Z1211 Encounter for screening for malignant neoplasm of colon: Secondary | ICD-10-CM | POA: Diagnosis present

## 2018-04-23 DIAGNOSIS — Z7982 Long term (current) use of aspirin: Secondary | ICD-10-CM | POA: Insufficient documentation

## 2018-04-23 DIAGNOSIS — E039 Hypothyroidism, unspecified: Secondary | ICD-10-CM | POA: Diagnosis not present

## 2018-04-23 DIAGNOSIS — K5730 Diverticulosis of large intestine without perforation or abscess without bleeding: Secondary | ICD-10-CM | POA: Diagnosis not present

## 2018-04-23 DIAGNOSIS — K648 Other hemorrhoids: Secondary | ICD-10-CM

## 2018-04-23 DIAGNOSIS — I10 Essential (primary) hypertension: Secondary | ICD-10-CM | POA: Diagnosis not present

## 2018-04-23 DIAGNOSIS — E119 Type 2 diabetes mellitus without complications: Secondary | ICD-10-CM | POA: Insufficient documentation

## 2018-04-23 HISTORY — PX: COLONOSCOPY WITH PROPOFOL: SHX5780

## 2018-04-23 LAB — GLUCOSE, CAPILLARY: Glucose-Capillary: 121 mg/dL — ABNORMAL HIGH (ref 70–99)

## 2018-04-23 SURGERY — COLONOSCOPY WITH PROPOFOL
Anesthesia: General

## 2018-04-23 MED ORDER — PROPOFOL 500 MG/50ML IV EMUL
INTRAVENOUS | Status: DC | PRN
  Administered 2018-04-23: 130 ug/kg/min via INTRAVENOUS

## 2018-04-23 MED ORDER — PROPOFOL 500 MG/50ML IV EMUL
INTRAVENOUS | Status: AC
  Filled 2018-04-23: qty 50

## 2018-04-23 MED ORDER — PROPOFOL 10 MG/ML IV BOLUS
INTRAVENOUS | Status: DC | PRN
  Administered 2018-04-23: 50 mg via INTRAVENOUS

## 2018-04-23 MED ORDER — LIDOCAINE HCL (PF) 2 % IJ SOLN
INTRAMUSCULAR | Status: DC | PRN
  Administered 2018-04-23: 80 mg via INTRADERMAL

## 2018-04-23 MED ORDER — SODIUM CHLORIDE 0.9 % IV SOLN
INTRAVENOUS | Status: DC
  Administered 2018-04-23: 09:00:00 via INTRAVENOUS

## 2018-04-23 MED ORDER — PROPOFOL 10 MG/ML IV BOLUS
INTRAVENOUS | Status: AC
  Filled 2018-04-23: qty 20

## 2018-04-23 MED ORDER — PHENYLEPHRINE HCL 10 MG/ML IJ SOLN
INTRAMUSCULAR | Status: DC | PRN
  Administered 2018-04-23: 100 ug via INTRAVENOUS
  Administered 2018-04-23: 50 ug via INTRAVENOUS

## 2018-04-23 NOTE — H&P (Signed)
Lorraine Antigua, MD [address], [place], [place], [place], [address] [address], [place], [place], [place], [address] Phone: [phone]  Fax: [phone]  Primary Care Physician:  Casilda Carls, MD   Pre-Procedure History & Physical: HPI:  Lorraine Lee is a 67 y.o. female is here for a colonoscopy.   Past Medical History:  Diagnosis Date  . Anemia   . Colon cancer ([place]) 2006   chemo tx   . Diabetes mellitus without complication ([place])   . Diverticulosis   . Gall stone   . GERD (gastroesophageal reflux disease)    h/o  . Hypertension   . Hypothyroidism    h/o no meds currently  . Internal hemorrhoids without mention of complication   . Personal history of malignant neoplasm of large intestine     Past Surgical History:  Procedure Laterality Date  . CESAREAN SECTION     x4  . CHOLECYSTECTOMY N/A 04/30/2017   Procedure: LAPAROSCOPIC CONVERTED TO OPEN CHOLECYSTECTOMY WITH INTRAOPERATIVE CHOLANGIOGRAM;  Surgeon: Christene Lye, MD;  Location: [facility];  Service: General;  Laterality: N/A;  . COLON RESECTION  2005  . COLONOSCOPY WITH ESOPHAGOGASTRODUODENOSCOPY (EGD)  2015  . ILEOSTOMY CLOSURE    . KNEE ARTHROSCOPY WITH MEDIAL MENISECTOMY Right 03/17/2014   Procedure: RIGHT ARTHROSCOPY KNEE,MEDIAL MENISECTOMY AND DEBRIDEMENT CHONDROMALACIA, ;  Surgeon: Kerin Salen, MD;  Location: [facility];  Service: Orthopedics;  Laterality: Right;  . UMBILICAL HERNIA REPAIR N/A 04/30/2017   Procedure: HERNIA REPAIR UMBILICAL ADULT;  Surgeon: Christene Lye, MD;  Location: [facility];  Service: General;  Laterality: N/A;    Prior to Admission medications   Medication Sig Start Date End Date Taking? Authorizing Provider  aspirin EC 81 MG tablet Take 81 mg by mouth daily.   Yes [provider]  Choline Fenofibrate (TRILIPIX) 135 MG capsule Take 135 mg by mouth every morning.    Yes [provider]  losartan (COZAAR) 25 MG tablet  Take 25 mg by mouth every morning.   Yes [provider]  metFORMIN (GLUCOPHAGE) 500 MG tablet Take by mouth daily with breakfast.   Yes [provider]  Multiple Vitamin (MULTIVITAMIN) tablet Take 1 tablet by mouth daily.   Yes [provider]  rosuvastatin (CRESTOR) 20 MG tablet Take 20 mg by mouth every morning.    Yes [provider]  venlafaxine XR (EFFEXOR-XR) 150 MG 24 hr capsule Take 150 mg by mouth daily with breakfast.   Yes [provider]  calcium carbonate (TUMS - DOSED IN MG ELEMENTAL CALCIUM) 500 MG chewable tablet Chew 1 tablet by mouth as needed for indigestion or heartburn.    [provider]  Cholecalciferol (VITAMIN D PO) Take 5,000 Units by mouth daily.    [provider]  omeprazole (PRILOSEC) 40 MG capsule Take 1 capsule (40 mg total) by mouth daily. 05/08/13   Lorraine Dragon, MD  oxyCODONE (ROXICODONE) 5 MG immediate release tablet Take 1 tablet (5 mg total) by mouth every 4 (four) hours as needed. 05/02/17 05/02/18  Christene Lye, MD    Allergies as of 03/31/2018 - Review Complete 05/16/2017  Allergen Reaction Noted  . Sulfonamide derivatives    . Tetanus toxoid      Family History  Problem Relation Age of Onset  . Diabetes Father   . Heart disease Father   . Colon cancer Maternal Uncle     Social History   Socioeconomic History  . Marital status: Married  Spouse name: Not on file  . Number of children: 4  . Years of education: Not on file  . Highest education level: Not on file  Occupational History  . Occupation: Cabin crew  Social Needs  . Financial resource strain: Not on file  . Food insecurity:    Worry: Not on file    Inability: Not on file  . Transportation needs:    Medical: Not on file    Non-medical: Not on file  Tobacco Use  . Smoking status: Never Smoker  . Smokeless tobacco: Never Used  Substance and Sexual Activity  . Alcohol use: No  . Drug use: No  . Sexual  activity: Not on file  Lifestyle  . Physical activity:    Days per week: Not on file    Minutes per session: Not on file  . Stress: Not on file  Relationships  . Social connections:    Talks on phone: Not on file    Gets together: Not on file    Attends religious service: Not on file    Active member of club or organization: Not on file    Attends meetings of clubs or organizations: Not on file    Relationship status: Not on file  . Intimate partner violence:    Fear of current or ex partner: Not on file    Emotionally abused: Not on file    Physically abused: Not on file    Forced sexual activity: Not on file  Other Topics Concern  . Not on file  Social History Narrative  . Not on file    Review of Systems: See HPI, otherwise negative ROS  Physical Exam: There were no vitals taken for this visit. General:   Alert,  pleasant and cooperative in NAD Head:  Normocephalic and atraumatic. Neck:  Supple; no masses or thyromegaly. Lungs:  Clear throughout to auscultation, normal respiratory effort.    Heart:  +S1, +S2, Regular rate and rhythm, No edema. Abdomen:  Soft, nontender and nondistended. Normal bowel sounds, without guarding, and without rebound.   Neurologic:  Alert and  oriented x4;  grossly normal neurologically.  Impression/Plan: Lorraine Lee is here for a colonoscopy to be performed for high risk screening, history of colon cancer s/p resection in 2005.  Risks, benefits, limitations, and alternatives regarding  colonoscopy have been reviewed with the patient.  Questions have been answered.  All parties agreeable.   Virgel Manifold, MD  04/23/2018, 9:01 AM

## 2018-04-23 NOTE — Transfer of Care (Signed)
Immediate Anesthesia Transfer of Care Note  Patient: Lorraine Lee  Procedure(s) Performed: COLONOSCOPY WITH PROPOFOL (N/A )  Patient Location: PACU  Anesthesia Type:General  Level of Consciousness: awake  Airway & Oxygen Therapy: Patient Spontanous Breathing and Patient connected to nasal cannula oxygen  Post-op Assessment: Report given to RN  Post vital signs: Reviewed and stable  Last Vitals:  Vitals Value Taken Time  BP 96/57 04/23/2018  9:55 AM  Temp 36.3 C 04/23/2018  9:52 AM  Pulse 65 04/23/2018  9:55 AM  Resp 17 04/23/2018  9:55 AM  SpO2 96 % 04/23/2018  9:55 AM  Vitals shown include unvalidated device data.  Last Pain:  Vitals:   04/23/18 0952  TempSrc: Tympanic  PainSc:          Complications: No apparent anesthesia complications

## 2018-04-23 NOTE — Anesthesia Post-op Follow-up Note (Signed)
Anesthesia QCDR form completed.        

## 2018-04-23 NOTE — Op Note (Signed)
[facility] Patient Name: Lorraine Lee Procedure Date: 04/23/2018 8:58 AM MRN: [id number] Account #: [id number] Date of Birth: 01/22/1951 Admit Type: Outpatient Age: 67 Room: [facility] ENDO ROOM 4 Gender: Female Note Status: Finalized Procedure:            Colonoscopy Indications:          High risk colon cancer surveillance: Personal history                        of colon cancer Providers:            Shashana Fullington B. Bonna Gains MD, MD Referring MD:         Casilda Carls, MD (Referring MD) Medicines:            Monitored Anesthesia Care Complications:        No immediate complications. Procedure:            Pre-Anesthesia Assessment:                       - ASA Grade Assessment: II - A patient with mild                        systemic disease.                       - Prior to the procedure, a History and Physical was                        performed, and patient medications, allergies and                        sensitivities were reviewed. The patient's tolerance of                        previous anesthesia was reviewed.                       - The risks and benefits of the procedure and the                        sedation options and risks were discussed with the                        patient. All questions were answered and informed                        consent was obtained.                       - Patient identification and proposed procedure were                        verified prior to the procedure by the physician, the                        nurse, the anesthesiologist, the anesthetist and the                        technician. The procedure was verified in the procedure  room.                       After obtaining informed consent, the colonoscope was                        passed under direct vision. Throughout the procedure,                        the patient's blood pressure, pulse, and oxygen   saturations were monitored continuously. The                        Colonoscope was introduced through the anus and                        advanced to the the cecum, identified by appendiceal                        orifice and ileocecal valve. The colonoscopy was                        performed with ease. The patient tolerated the                        procedure well. The quality of the bowel preparation                        was good. Findings:      The perianal and digital rectal examinations were normal.      Three sessile polyps were found in the ascending colon and cecum. The       polyps were 4 to 6 mm in size. These polyps were removed with a cold       snare. Resection and retrieval were complete.      A 3 mm polyp was found in the ascending colon. The polyp was sessile.       The polyp was removed with a cold biopsy forceps. Resection and       retrieval were complete.      A 4 mm polyp was found in the sigmoid colon. The polyp was sessile. The       polyp was removed with a cold biopsy forceps. Resection and retrieval       were complete.      Multiple diverticula were found in the sigmoid colon and ascending colon.      There was evidence of a prior surgical anastomosis at 12 cm proximal to       the anus. This was patent and was characterized by healthy appearing       mucosa. The anastomosis was traversed.      The exam was otherwise without abnormality.      The rectum, sigmoid colon, descending colon, transverse colon, ascending       colon and cecum appeared normal.      Non-bleeding internal hemorrhoids were found during retroflexion. Impression:           - Three 4 to 6 mm polyps in the ascending colon and in                        the cecum, removed with a cold snare. Resected and  retrieved.                       - One 3 mm polyp in the ascending colon, removed with a                        cold biopsy forceps. Resected and retrieved.                        - One 4 mm polyp in the sigmoid colon, removed with a                        cold biopsy forceps. Resected and retrieved.                       - Diverticulosis in the sigmoid colon and in the                        ascending colon.                       - Patent surgical anastomosis, characterized by healthy                        appearing mucosa.                       - The examination was otherwise normal.                       - The rectum, sigmoid colon, descending colon,                        transverse colon, ascending colon and cecum are normal.                       - Non-bleeding internal hemorrhoids. Recommendation:       - Discharge patient to home (with escort).                       - Advance diet as tolerated.                       - Continue present medications.                       - Await pathology results.                       - Repeat colonoscopy date to be determined after                        pending pathology results are reviewed for surveillance.                       - The findings and recommendations were discussed with                        the patient.                       - The findings and recommendations were discussed with  the patient's family.                       - Return to primary care physician as previously                        scheduled.                       - High fiber diet. Procedure Code(s):    --- Professional ---                       [phone], Colonoscopy, flexible; with removal of tumor(s),                        polyp(s), or other lesion(s) by snare technique                       45380, 64, Colonoscopy, flexible; with biopsy, single                        or multiple Diagnosis Code(s):    --- Professional ---                       H70.263, Personal history of other malignant neoplasm                        of large intestine                       D12.2, Benign neoplasm of ascending colon                        D12.0, Benign neoplasm of cecum                       D12.5, Benign neoplasm of sigmoid colon                       K64.8, Other hemorrhoids                       Z98.0, Intestinal bypass and anastomosis status                       K57.30, Diverticulosis of large intestine without                        perforation or abscess without bleeding CPT copyright 2018 American Medical Association. All rights reserved. The codes documented in this report are preliminary and upon coder review may  be revised to meet current compliance requirements.  Vonda Antigua, MD Margretta Sidle B. Bonna Gains MD, MD 04/23/2018 9:58:31 AM This report has been signed electronically. Number of Addenda: 0 Note Initiated On: 04/23/2018 8:58 AM Scope Withdrawal Time: 0 hours 24 minutes 6 seconds  Total Procedure Duration: 0 hours 32 minutes 5 seconds  Estimated Blood Loss: Estimated blood loss: none.      [employer]

## 2018-04-23 NOTE — Anesthesia Postprocedure Evaluation (Signed)
Anesthesia Post Note  Patient: Product/process development scientist  Procedure(s) Performed: COLONOSCOPY WITH PROPOFOL (N/A )  Patient location during evaluation: PACU Anesthesia Type: General Level of consciousness: awake and alert Pain management: pain level controlled Vital Signs Assessment: post-procedure vital signs reviewed and stable Respiratory status: spontaneous breathing, nonlabored ventilation, respiratory function stable and patient connected to nasal cannula oxygen Cardiovascular status: blood pressure returned to baseline and stable Postop Assessment: no apparent nausea or vomiting Anesthetic complications: no     Last Vitals:  Vitals:   04/23/18 1022 04/23/18 1032  BP: 102/69 104/71  Pulse: 66 (!) 57  Resp: 20 15  Temp:    SpO2: 96% 97%    Last Pain:  Vitals:   04/23/18 1032  TempSrc:   PainSc: 0-No pain                 Durenda Hurt

## 2018-04-23 NOTE — Anesthesia Preprocedure Evaluation (Addendum)
Anesthesia Evaluation  Patient identified by MRN, date of birth, ID band Patient awake    Reviewed: Allergy & Precautions, H&P , NPO status , Patient's Chart, lab work & pertinent test results  Airway Mallampati: III       Dental  (+) Teeth Intact   Pulmonary neg pulmonary ROS,           Cardiovascular hypertension,      Neuro/Psych negative neurological ROS  negative psych ROS   GI/Hepatic Neg liver ROS, GERD  ,H/o colon cancer    Endo/Other  diabetesHypothyroidism   Renal/GU negative Renal ROS  negative genitourinary   Musculoskeletal   Abdominal   Peds  Hematology  (+) Blood dyscrasia, anemia ,   Anesthesia Other Findings Past Medical History: No date: Anemia 2006: Colon cancer ([place])     Comment:  chemo tx  No date: Diabetes mellitus without complication (HCC) No date: Diverticulosis No date: Gall stone No date: GERD (gastroesophageal reflux disease)     Comment:  h/o No date: Hypertension No date: Hypothyroidism     Comment:  h/o no meds currently No date: Internal hemorrhoids without mention of complication No date: Personal history of malignant neoplasm of large intestine  Past Surgical History: No date: CESAREAN SECTION     Comment:  x4 04/30/2017: CHOLECYSTECTOMY; N/A     Comment:  Procedure: LAPAROSCOPIC CONVERTED TO OPEN               CHOLECYSTECTOMY WITH INTRAOPERATIVE CHOLANGIOGRAM;                Surgeon: Christene Lye, MD;  Location: [facility];              Service: General;  Laterality: N/A; 2005: COLON RESECTION 2015: COLONOSCOPY WITH ESOPHAGOGASTRODUODENOSCOPY (EGD) No date: ILEOSTOMY CLOSURE 03/17/2014: KNEE ARTHROSCOPY WITH MEDIAL MENISECTOMY; Right     Comment:  Procedure: RIGHT ARTHROSCOPY KNEE,MEDIAL MENISECTOMY AND              DEBRIDEMENT CHONDROMALACIA, ;  Surgeon: Kerin Salen,               MD;  Location: [place];  Service:                Orthopedics;  Laterality: Right; 02/01/2835: UMBILICAL HERNIA REPAIR; N/A     Comment:  Procedure: HERNIA REPAIR UMBILICAL ADULT;  Surgeon:               Christene Lye, MD;  Location: [facility];                Service: General;  Laterality: N/A;     Reproductive/Obstetrics negative OB ROS                           Anesthesia Physical Anesthesia Plan  ASA: II  Anesthesia Plan: General   Post-op Pain Management:    Induction:   PONV Risk Score and Plan: Propofol infusion and TIVA  Airway Management Planned:   Additional Equipment:   Intra-op Plan:   Post-operative Plan:   Informed Consent: I have reviewed the patients History and Physical, chart, labs and discussed the procedure including the risks, benefits and alternatives for the proposed anesthesia with the patient or authorized representative who has indicated his/her understanding and acceptance.   Dental Advisory Given  Plan Discussed with: Anesthesiologist  Anesthesia Plan Comments:       Anesthesia Quick Evaluation

## 2018-04-28 ENCOUNTER — Encounter: Payer: Self-pay | Admitting: Gastroenterology

## 2018-04-29 LAB — SURGICAL PATHOLOGY

## 2018-05-07 ENCOUNTER — Encounter: Payer: Self-pay | Admitting: Gastroenterology

## 2019-07-31 DIAGNOSIS — M7541 Impingement syndrome of right shoulder: Secondary | ICD-10-CM | POA: Insufficient documentation

## 2019-07-31 DIAGNOSIS — M5412 Radiculopathy, cervical region: Secondary | ICD-10-CM | POA: Insufficient documentation

## 2020-02-03 ENCOUNTER — Ambulatory Visit: Payer: Medicare PPO

## 2020-02-03 DIAGNOSIS — Z23 Encounter for immunization: Secondary | ICD-10-CM

## 2020-08-01 ENCOUNTER — Ambulatory Visit (INDEPENDENT_AMBULATORY_CARE_PROVIDER_SITE_OTHER): Payer: Medicare PPO | Admitting: Family Medicine

## 2020-08-01 ENCOUNTER — Other Ambulatory Visit: Payer: Self-pay

## 2020-08-01 DIAGNOSIS — Z862 Personal history of diseases of the blood and blood-forming organs and certain disorders involving the immune mechanism: Secondary | ICD-10-CM

## 2020-08-01 DIAGNOSIS — Z0000 Encounter for general adult medical examination without abnormal findings: Secondary | ICD-10-CM

## 2020-08-01 DIAGNOSIS — E1169 Type 2 diabetes mellitus with other specified complication: Secondary | ICD-10-CM

## 2020-08-02 LAB — LIPID PANEL
Cholesterol: 193 mg/dL (ref <200)
HDL: 42 mg/dL — ABNORMAL LOW (ref >=50)
Non-HDL Cholesterol (Calc): 151 mg/dL (calc) — ABNORMAL HIGH (ref <130)
Total CHOL/HDL Ratio: 4.6 (calc) (ref <5.0)
Triglycerides: 409 mg/dL — ABNORMAL HIGH (ref <150)

## 2020-08-02 LAB — CBC WITH DIFFERENTIAL/PLATELET
Absolute Monocytes: 426 cells/uL (ref 200–950)
Basophils Absolute: 39 cells/uL (ref 0–200)
Basophils Relative: 0.7 %
Eosinophils Absolute: 274 cells/uL (ref 15–500)
Eosinophils Relative: 4.9 %
HCT: 41 % (ref 35.0–45.0)
Hemoglobin: 14 g/dL (ref 11.7–15.5)
Lymphs Abs: 2212 cells/uL (ref 850–3900)
MCH: 31.7 pg (ref 27.0–33.0)
MCHC: 34.1 g/dL (ref 32.0–36.0)
MCV: 92.8 fL (ref 80.0–100.0)
MPV: 10.9 fL (ref 7.5–12.5)
Monocytes Relative: 7.6 %
Neutro Abs: 2649 cells/uL (ref 1500–7800)
Neutrophils Relative %: 47.3 %
Platelets: 233 10*3/uL (ref 140–400)
RBC: 4.42 10*6/uL (ref 3.80–5.10)
RDW: 12.9 % (ref 11.0–15.0)
Total Lymphocyte: 39.5 %
WBC: 5.6 10*3/uL (ref 3.8–10.8)

## 2020-08-02 LAB — COMPLETE METABOLIC PANEL WITH GFR
AG Ratio: 1.6 (calc) (ref 1.0–2.5)
ALT: 52 U/L — ABNORMAL HIGH (ref 6–29)
AST: 45 U/L — ABNORMAL HIGH (ref 10–35)
Albumin: 4.2 g/dL (ref 3.6–5.1)
Alkaline phosphatase (APISO): 53 U/L (ref 37–153)
BUN: 18 mg/dL (ref 7–25)
CO2: 25 mmol/L (ref 20–32)
Calcium: 9.5 mg/dL (ref 8.6–10.4)
Chloride: 105 mmol/L (ref 98–110)
Creat: 0.73 mg/dL (ref 0.50–0.99)
GFR, Est African American: 97 mL/min/{1.73_m2} (ref >=60)
GFR, Est Non African American: 84 mL/min/{1.73_m2} (ref >=60)
Globulin: 2.6 g/dL (calc) (ref 1.9–3.7)
Glucose, Bld: 193 mg/dL — ABNORMAL HIGH (ref 65–99)
Potassium: 4.5 mmol/L (ref 3.5–5.3)
Sodium: 139 mmol/L (ref 135–146)
Total Bilirubin: 0.4 mg/dL (ref 0.2–1.2)
Total Protein: 6.8 g/dL (ref 6.1–8.1)

## 2020-08-02 LAB — VITAMIN D 25 HYDROXY (VIT D DEFICIENCY, FRACTURES): Vit D, 25-Hydroxy: 24 ng/mL — ABNORMAL LOW (ref 30–100)

## 2020-08-02 LAB — B12 AND FOLATE PANEL
Folate: >24 ng/mL
Vitamin B-12: 1360 pg/mL — ABNORMAL HIGH (ref 200–1100)

## 2020-08-02 LAB — HEMOGLOBIN A1C
Hgb A1c MFr Bld: 8.4 % of total Hgb — ABNORMAL HIGH (ref <5.7)
Mean Plasma Glucose: 194 mg/dL
eAG (mmol/L): 10.8 mmol/L

## 2020-08-02 LAB — TSH: TSH: 0.35 mIU/L — ABNORMAL LOW (ref 0.40–4.50)

## 2020-08-02 NOTE — Addendum Note (Signed)
Addended by: Alois Cliche on: 08/02/2020 03:39 PM   Modules accepted: Orders

## 2020-08-03 LAB — MICROALBUMIN, URINE: Microalb, Ur: 5.1 mg/dL

## 2020-10-07 ENCOUNTER — Other Ambulatory Visit (INDEPENDENT_AMBULATORY_CARE_PROVIDER_SITE_OTHER): Payer: Medicare PPO

## 2020-10-07 ENCOUNTER — Other Ambulatory Visit: Payer: Self-pay

## 2020-10-07 DIAGNOSIS — E785 Hyperlipidemia, unspecified: Secondary | ICD-10-CM

## 2020-10-07 DIAGNOSIS — E669 Obesity, unspecified: Secondary | ICD-10-CM

## 2020-10-07 DIAGNOSIS — E1169 Type 2 diabetes mellitus with other specified complication: Secondary | ICD-10-CM

## 2020-10-07 DIAGNOSIS — Z0000 Encounter for general adult medical examination without abnormal findings: Secondary | ICD-10-CM

## 2020-10-07 LAB — POCT GLYCOSYLATED HEMOGLOBIN (HGB A1C): Hemoglobin A1C: 7.4 % — AB (ref 4.0–5.6)

## 2020-10-07 LAB — GLUCOSE, POCT (MANUAL RESULT ENTRY): POC Glucose: 180 mg/dl — AB (ref 70–99)

## 2020-10-11 LAB — TSH: TSH: 0.22 mIU/L — ABNORMAL LOW (ref 0.40–4.50)

## 2020-10-11 LAB — MICROALBUMIN, URINE: Microalb, Ur: 0.9 mg/dL

## 2020-10-11 LAB — T4: T4, Total: 6.7 ug/dL (ref 5.1–11.9)

## 2020-10-11 LAB — HEMOGLOBIN A1C

## 2020-10-11 LAB — T3: T3, Total: 105 ng/dL (ref 76–181)

## 2020-10-17 NOTE — Progress Notes (Signed)
Established Patient Office Visit  Subjective:  Patient ID: Lorraine Lee, female    DOB: [birth year]  Age: 70 y.o. MRN: [id number]  CC: No chief complaint on file.   HPI  Lorraine Lee presents for a lab test as ordered by Dr.jadali  Past Medical History:  Diagnosis Date  . Anemia   . Colon cancer ([place]) 2006   chemo tx   . Diabetes mellitus without complication ([place])   . Diverticulosis   . Gall stone   . GERD (gastroesophageal reflux disease)    h/o  . Hypertension   . Hypothyroidism    h/o no meds currently  . Internal hemorrhoids without mention of complication   . Personal history of malignant neoplasm of large intestine     Past Surgical History:  Procedure Laterality Date  . CESAREAN SECTION     x4  . CHOLECYSTECTOMY N/A 04/30/2017   Procedure: LAPAROSCOPIC CONVERTED TO OPEN CHOLECYSTECTOMY WITH INTRAOPERATIVE CHOLANGIOGRAM;  Surgeon: Christene Lye, MD;  Location: [facility];  Service: General;  Laterality: N/A;  . COLON RESECTION  2005  . COLONOSCOPY WITH ESOPHAGOGASTRODUODENOSCOPY (EGD)  2015  . COLONOSCOPY WITH PROPOFOL N/A 04/23/2018   Procedure: COLONOSCOPY WITH PROPOFOL;  Surgeon: Virgel Manifold, MD;  Location: [facility];  Service: Endoscopy;  Laterality: N/A;  . ILEOSTOMY CLOSURE    . KNEE ARTHROSCOPY WITH MEDIAL MENISECTOMY Right 03/17/2014   Procedure: RIGHT ARTHROSCOPY KNEE,MEDIAL MENISECTOMY AND DEBRIDEMENT CHONDROMALACIA, ;  Surgeon: Kerin Salen, MD;  Location: [place];  Service: Orthopedics;  Laterality: Right;  . UMBILICAL HERNIA REPAIR N/A 04/30/2017   Procedure: HERNIA REPAIR UMBILICAL ADULT;  Surgeon: Christene Lye, MD;  Location: [facility];  Service: General;  Laterality: N/A;    Family History  Problem Relation Age of Onset  . Diabetes Father   . Heart disease Father   . Colon cancer Maternal Uncle     Social History   Socioeconomic History  . Marital status: Married    Spouse name: Not on  file  . Number of children: 4  . Years of education: Not on file  . Highest education level: Not on file  Occupational History  . Occupation: Realtor  Tobacco Use  . Smoking status: Never Smoker  . Smokeless tobacco: Never Used  Vaping Use  . Vaping Use: Never used  Substance and Sexual Activity  . Alcohol use: No  . Drug use: No  . Sexual activity: Not on file  Other Topics Concern  . Not on file  Social History Narrative  . Not on file   Social Determinants of Health   Financial Resource Strain: Not on file  Food Insecurity: Not on file  Transportation Needs: Not on file  Physical Activity: Not on file  Stress: Not on file  Social Connections: Not on file  Intimate Partner Violence: Not on file     Current Outpatient Medications:  .  aspirin EC 81 MG tablet, Take 81 mg by mouth daily., Disp: , Rfl:  .  calcium carbonate (TUMS - DOSED IN MG ELEMENTAL CALCIUM) 500 MG chewable tablet, Chew 1 tablet by mouth as needed for indigestion or heartburn., Disp: , Rfl:  .  Cholecalciferol (VITAMIN D PO), Take 5,000 Units by mouth daily., Disp: , Rfl:  .  Choline Fenofibrate (TRILIPIX) 135 MG capsule, Take 135 mg by mouth every morning. , Disp: , Rfl:  .  losartan (COZAAR) 25 MG tablet, Take 25 mg by mouth every morning., Disp: , Rfl:  .  metFORMIN (GLUCOPHAGE) 500 MG tablet, Take by mouth daily with breakfast., Disp: , Rfl:  .  Multiple Vitamin (MULTIVITAMIN) tablet, Take 1 tablet by mouth daily., Disp: , Rfl:  .  omeprazole (PRILOSEC) 40 MG capsule, Take 1 capsule (40 mg total) by mouth daily., Disp: 30 capsule, Rfl: 1 .  rosuvastatin (CRESTOR) 20 MG tablet, Take 20 mg by mouth every morning. , Disp: , Rfl:  .  venlafaxine XR (EFFEXOR-XR) 150 MG 24 hr capsule, Take 150 mg by mouth daily with breakfast., Disp: , Rfl:    Allergies  Allergen Reactions  . Sulfonamide Derivatives     REACTION: rash  . Tetanus Toxoid     REACTION: fever and swelling of site    ROS Review of  Systems    Objective:    Physical Exam  There were no vitals taken for this visit. Wt Readings from Last 3 Encounters:  04/23/18 195 lb (88.5 kg)  05/16/17 195 lb (88.5 kg)  05/09/17 191 lb (86.6 kg)     Health Maintenance Due  Topic Date Due  . Pneumococcal Vaccine 80-77 Years old (1 of 4 - PCV13) Never done  . FOOT EXAM  Never done  . OPHTHALMOLOGY EXAM  Never done  . Hepatitis C Screening  Never done  . TETANUS/TDAP  Never done  . Zoster Vaccines- Shingrix (1 of 2) Never done  . DEXA SCAN  Never done  . PNA vac Low Risk Adult (2 of 2 - PCV13) 05/01/2018  . MAMMOGRAM  08/23/2018    There are no preventive care reminders to display for this patient.  Lab Results  Component Value Date   TSH 0.22 (L) 10/07/2020   Lab Results  Component Value Date   WBC 5.6 08/01/2020   HGB 14.0 08/01/2020   HCT 41.0 08/01/2020   MCV 92.8 08/01/2020   PLT 233 08/01/2020   Lab Results  Component Value Date   NA 139 08/01/2020   K 4.5 08/01/2020   CO2 25 08/01/2020   GLUCOSE 193 (H) 08/01/2020   BUN 18 08/01/2020   CREATININE 0.73 08/01/2020   BILITOT 0.4 08/01/2020   ALKPHOS 41 05/01/2017   AST 45 (H) 08/01/2020   ALT 52 (H) 08/01/2020   PROT 6.8 08/01/2020   ALBUMIN 2.9 (L) 05/01/2017   CALCIUM 9.5 08/01/2020   ANIONGAP 5 05/01/2017   Lab Results  Component Value Date   CHOL 193 08/01/2020   Lab Results  Component Value Date   HDL 42 (L) 08/01/2020   Lab Results  Component Value Date   [facility]  08/01/2020     Comment:     . LDL cholesterol not calculated. Triglyceride levels greater than 400 mg/dL invalidate calculated LDL results. . Reference range: <100 . Desirable range <100 mg/dL for primary prevention;   <70 mg/dL for patients with CHD or diabetic patients  with > or = 2 CHD risk factors. Marland Kitchen LDL-C is now calculated using the Martin-Hopkins  calculation, which is a validated novel method providing  better accuracy than the Friedewald equation in the   estimation of LDL-C.  Cresenciano Genre et al. Annamaria Helling. 1194;174(08): 2061-2068  (http://education.[employer].com/faq/FAQ164)    Lab Results  Component Value Date   TRIG 409 (H) 08/01/2020   Lab Results  Component Value Date   CHOLHDL 4.6 08/01/2020   Lab Results  Component Value Date   HGBA1C 7.4 (A) 10/07/2020      Assessment & Plan:   Problem List Items Addressed This Visit  Endocrine   Type 2 diabetes mellitus with other specified complication ([place]) - Primary    Patient is on metformin lab test sent to Dr. Rosario Jacks        Other   Obesity    Behavioral modification strategies: increasing lean protein intake, decreasing simple carbohydrates, increasing vegetables, increasing water intake, decreasing eating out, no skipping meals, meal planning and cooking strategies, keeping healthy foods in the home and planning for success.         No orders of the defined types were placed in this encounter.   Follow-up: No follow-ups on file.    Cletis Athens, MD

## 2020-10-28 DIAGNOSIS — E669 Obesity, unspecified: Secondary | ICD-10-CM | POA: Insufficient documentation

## 2020-10-28 DIAGNOSIS — E1169 Type 2 diabetes mellitus with other specified complication: Secondary | ICD-10-CM | POA: Insufficient documentation

## 2020-10-28 NOTE — Addendum Note (Signed)
Addended by: Cletis Athens on: 10/28/2020 06:29 PM   Modules accepted: Level of Service

## 2020-10-28 NOTE — Assessment & Plan Note (Signed)
Patient is on metformin lab test sent to Dr. Rosario Jacks

## 2020-10-28 NOTE — Assessment & Plan Note (Signed)
Behavioral modification strategies: increasing lean protein intake, decreasing simple carbohydrates, increasing vegetables, increasing water intake, decreasing eating out, no skipping meals, meal planning and cooking strategies, keeping healthy foods in the home and planning for success. 

## 2021-04-29 ENCOUNTER — Other Ambulatory Visit: Payer: Self-pay | Admitting: Internal Medicine

## 2021-05-01 LAB — NOVEL CORONAVIRUS, NAA: SARS-CoV-2, NAA: DETECTED — AB

## 2021-05-05 ENCOUNTER — Other Ambulatory Visit: Payer: Self-pay

## 2021-05-05 DIAGNOSIS — E1100 Type 2 diabetes mellitus with hyperosmolarity without nonketotic hyperglycemic-hyperosmolar coma (NKHHC): Secondary | ICD-10-CM

## 2021-05-05 DIAGNOSIS — Z0001 Encounter for general adult medical examination with abnormal findings: Secondary | ICD-10-CM

## 2021-05-05 DIAGNOSIS — E782 Mixed hyperlipidemia: Secondary | ICD-10-CM

## 2021-05-05 MED ORDER — CETIRIZINE HCL 10 MG PO TABS: ORAL_TABLET | ORAL | 0 refills | Status: DC

## 2021-05-09 LAB — CBC WITH DIFFERENTIAL/PLATELET
Basophils Absolute: 0.1 10*3/uL (ref 0.0–0.2)
Basos: 1 %
EOS (ABSOLUTE): 0.2 10*3/uL (ref 0.0–0.4)
Eos: 3 %
Hematocrit: 42.7 % (ref 34.0–46.6)
Hemoglobin: 14.1 g/dL (ref 11.1–15.9)
Immature Grans (Abs): 0 10*3/uL (ref 0.0–0.1)
Immature Granulocytes: 1 %
Lymphocytes Absolute: 2 10*3/uL (ref 0.7–3.1)
Lymphs: 33 %
MCH: 30.1 pg (ref 26.6–33.0)
MCHC: 33 g/dL (ref 31.5–35.7)
MCV: 91 fL (ref 79–97)
Monocytes Absolute: 0.5 10*3/uL (ref 0.1–0.9)
Monocytes: 8 %
Neutrophils Absolute: 3.2 10*3/uL (ref 1.4–7.0)
Neutrophils: 54 %
Platelets: 259 10*3/uL (ref 150–450)
RBC: 4.68 x10E6/uL (ref 3.77–5.28)
RDW: 12.7 % (ref 11.7–15.4)
WBC: 5.8 10*3/uL (ref 3.4–10.8)

## 2021-05-09 LAB — LIPID PANEL WITH LDL/HDL RATIO
Cholesterol, Total: 158 mg/dL (ref 100–199)
HDL: 36 mg/dL — ABNORMAL LOW (ref >39)
LDL Chol Calc (NIH): 71 mg/dL (ref 0–99)
LDL/HDL Ratio: 2 ratio (ref 0.0–3.2)
Triglycerides: 316 mg/dL — ABNORMAL HIGH (ref 0–149)
VLDL Cholesterol Cal: 51 mg/dL — ABNORMAL HIGH (ref 5–40)

## 2021-05-09 LAB — COMP. METABOLIC PANEL (12)
AST: 41 IU/L — ABNORMAL HIGH (ref 0–40)
Albumin/Globulin Ratio: 1.6 (ref 1.2–2.2)
Albumin: 4.2 g/dL (ref 3.8–4.8)
Alkaline Phosphatase: 56 IU/L (ref 44–121)
BUN/Creatinine Ratio: 20 (ref 12–28)
BUN: 17 mg/dL (ref 8–27)
Bilirubin Total: 0.4 mg/dL (ref 0.0–1.2)
Calcium: 9.3 mg/dL (ref 8.7–10.3)
Chloride: 98 mmol/L (ref 96–106)
Creatinine, Ser: 0.86 mg/dL (ref 0.57–1.00)
Globulin, Total: 2.6 g/dL (ref 1.5–4.5)
Glucose: 191 mg/dL — ABNORMAL HIGH (ref 70–99)
Potassium: 4.2 mmol/L (ref 3.5–5.2)
Sodium: 137 mmol/L (ref 134–144)
Total Protein: 6.8 g/dL (ref 6.0–8.5)
eGFR: 73 mL/min/{1.73_m2} (ref >59)

## 2021-05-09 LAB — C-REACTIVE PROTEIN: CRP: <1 mg/L (ref 0–10)

## 2021-05-09 LAB — HEMOGLOBIN A1C
Est. average glucose Bld gHb Est-mCnc: 209 mg/dL
Hgb A1c MFr Bld: 8.9 % — ABNORMAL HIGH (ref 4.8–5.6)

## 2021-05-15 ENCOUNTER — Telehealth: Payer: Self-pay

## 2021-05-15 NOTE — Progress Notes (Signed)
Uncontrolled diabetes, modify therapy as indicated

## 2021-05-15 NOTE — Telephone Encounter (Signed)
Called [facility] and had TSH and Free T4 added onto labs.

## 2021-05-26 LAB — TSH: TSH: 0.952 u[IU]/mL (ref 0.450–4.500)

## 2021-05-26 LAB — SPECIMEN STATUS REPORT

## 2021-05-26 LAB — T4, FREE: Free T4: 1.36 ng/dL (ref 0.82–1.77)

## 2021-11-02 ENCOUNTER — Ambulatory Visit (INDEPENDENT_AMBULATORY_CARE_PROVIDER_SITE_OTHER): Payer: Medicare PPO | Admitting: *Deleted

## 2021-11-02 DIAGNOSIS — R5383 Other fatigue: Secondary | ICD-10-CM

## 2021-11-02 DIAGNOSIS — E559 Vitamin D deficiency, unspecified: Secondary | ICD-10-CM

## 2021-11-02 DIAGNOSIS — I10 Essential (primary) hypertension: Secondary | ICD-10-CM

## 2021-11-02 DIAGNOSIS — Z9181 History of falling: Secondary | ICD-10-CM

## 2021-11-02 DIAGNOSIS — E785 Hyperlipidemia, unspecified: Secondary | ICD-10-CM

## 2021-11-02 DIAGNOSIS — R42 Dizziness and giddiness: Secondary | ICD-10-CM

## 2021-11-02 DIAGNOSIS — F3289 Other specified depressive episodes: Secondary | ICD-10-CM

## 2021-11-03 LAB — CBC WITH DIFFERENTIAL/PLATELET
Absolute Monocytes: 383 cells/uL (ref 200–950)
Basophils Absolute: 41 cells/uL (ref 0–200)
Basophils Relative: 0.8 %
Eosinophils Absolute: 168 cells/uL (ref 15–500)
Eosinophils Relative: 3.3 %
HCT: 40.9 % (ref 35.0–45.0)
Hemoglobin: 13.7 g/dL (ref 11.7–15.5)
Lymphs Abs: 1601 cells/uL (ref 850–3900)
MCH: 31.1 pg (ref 27.0–33.0)
MCHC: 33.5 g/dL (ref 32.0–36.0)
MCV: 92.7 fL (ref 80.0–100.0)
MPV: 10.8 fL (ref 7.5–12.5)
Monocytes Relative: 7.5 %
Neutro Abs: 2907 cells/uL (ref 1500–7800)
Neutrophils Relative %: 57 %
Platelets: 241 10*3/uL (ref 140–400)
RBC: 4.41 10*6/uL (ref 3.80–5.10)
RDW: 12.2 % (ref 11.0–15.0)
Total Lymphocyte: 31.4 %
WBC: 5.1 10*3/uL (ref 3.8–10.8)

## 2021-11-03 LAB — HEPATIC FUNCTION PANEL
AG Ratio: 1.7 (calc) (ref 1.0–2.5)
ALT: 46 U/L — ABNORMAL HIGH (ref 6–29)
AST: 42 U/L — ABNORMAL HIGH (ref 10–35)
Albumin: 4.2 g/dL (ref 3.6–5.1)
Alkaline phosphatase (APISO): 57 U/L (ref 37–153)
Bilirubin, Direct: 0.1 mg/dL (ref 0.0–0.2)
Globulin: 2.5 g/dL (calc) (ref 1.9–3.7)
Indirect Bilirubin: 0.3 mg/dL (calc) (ref 0.2–1.2)
Total Bilirubin: 0.4 mg/dL (ref 0.2–1.2)
Total Protein: 6.7 g/dL (ref 6.1–8.1)

## 2021-11-03 LAB — TSH: TSH: 0.38 mIU/L — ABNORMAL LOW (ref 0.40–4.50)

## 2021-11-03 LAB — LIPID PANEL
Cholesterol: 156 mg/dL (ref <200)
HDL: 38 mg/dL — ABNORMAL LOW (ref >=50)
Non-HDL Cholesterol (Calc): 118 mg/dL (calc) (ref <130)
Total CHOL/HDL Ratio: 4.1 (calc) (ref <5.0)
Triglycerides: 487 mg/dL — ABNORMAL HIGH (ref <150)

## 2021-11-03 LAB — VITAMIN D 25 HYDROXY (VIT D DEFICIENCY, FRACTURES): Vit D, 25-Hydroxy: 25 ng/mL — ABNORMAL LOW (ref 30–100)

## 2021-11-07 ENCOUNTER — Telehealth: Payer: Self-pay

## 2021-11-07 NOTE — Telephone Encounter (Signed)
Gastroenterology Pre-Procedure Review Request Date: Wed 03/21/22 Office Visit  Requesting Physician: Dr. Marius Ditch  PATIENT REVIEW QUESTIONS: The patient responded to the following health history questions as indicated:    1. Are you having any GI issues? yes (occasional stomach aches, sometimes heart burn patient request office visit to discuss) 2. Do you have a personal history of Polyps? yes (personal history of colon cancer) 3. Do you have a family history of Colon Cancer or Polyps? yes (uncle colon cancer) 4. Diabetes Mellitus? yes (type 2) 5. Joint replacements in the past 12 months?no 6. Major health problems in the past 3 months?no 7. Any artificial heart valves, MVP, or defibrillator?no    MEDICATIONS & ALLERGIES:    Patient reports the following regarding taking any anticoagulation/antiplatelet therapy:   Plavix, Coumadin, Eliquis, Xarelto, Lovenox, Pradaxa, Brilinta, or Effient? no Aspirin? no  Patient confirms/reports the following medications:  Current Outpatient Medications  Medication Sig Dispense Refill   aspirin EC 81 MG tablet Take 81 mg by mouth daily.     calcium carbonate (TUMS - DOSED IN MG ELEMENTAL CALCIUM) 500 MG chewable tablet Chew 1 tablet by mouth as needed for indigestion or heartburn.     cetirizine (ZYRTEC) 10 MG tablet Take 1 Tablet once (at bedtime) orally as needed 14 tablet 0   Cholecalciferol (VITAMIN D PO) Take 5,000 Units by mouth daily.     Choline Fenofibrate (TRILIPIX) 135 MG capsule Take 135 mg by mouth every morning.      losartan (COZAAR) 25 MG tablet Take 25 mg by mouth every morning.     metFORMIN (GLUCOPHAGE) 500 MG tablet Take by mouth daily with breakfast.     Multiple Vitamin (MULTIVITAMIN) tablet Take 1 tablet by mouth daily.     omeprazole (PRILOSEC) 40 MG capsule Take 1 capsule (40 mg total) by mouth daily. 30 capsule 1   rosuvastatin (CRESTOR) 20 MG tablet Take 20 mg by mouth every morning.      venlafaxine XR (EFFEXOR-XR) 150 MG 24  hr capsule Take 150 mg by mouth daily with breakfast.     No current facility-administered medications for this visit.    Patient confirms/reports the following allergies:  Allergies  Allergen Reactions   Sulfonamide Derivatives     REACTION: rash   Tetanus Toxoid     REACTION: fever and swelling of site    No orders of the defined types were placed in this encounter.   AUTHORIZATION INFORMATION Primary Insurance: 1D#: Group #:  Secondary Insurance: 1D#: Group #:  SCHEDULE INFORMATION: Date: Office visit scheduled with Dr. Marius Ditch Time: Location:

## 2021-11-15 ENCOUNTER — Ambulatory Visit: Payer: Medicare PPO

## 2021-11-15 ENCOUNTER — Encounter: Payer: Self-pay | Admitting: *Deleted

## 2021-11-15 ENCOUNTER — Other Ambulatory Visit: Payer: Self-pay | Admitting: *Deleted

## 2021-11-15 DIAGNOSIS — R42 Dizziness and giddiness: Secondary | ICD-10-CM

## 2021-11-15 NOTE — Progress Notes (Unsigned)
Enrolled for [employer] to mail a ZIO XT long term holter monitor to the patients address on file.   Letter with instructions mailed to patient.  DOD to read. 

## 2022-02-22 ENCOUNTER — Other Ambulatory Visit (HOSPITAL_COMMUNITY): Payer: Self-pay | Admitting: Neurology

## 2022-02-22 ENCOUNTER — Other Ambulatory Visit: Payer: Self-pay | Admitting: Neurology

## 2022-02-22 ENCOUNTER — Other Ambulatory Visit: Payer: Self-pay

## 2022-02-22 DIAGNOSIS — R4189 Other symptoms and signs involving cognitive functions and awareness: Secondary | ICD-10-CM

## 2022-02-22 MED ORDER — LORAZEPAM 1 MG PO TABS
ORAL_TABLET | ORAL | 0 refills | Status: DC
  Filled 2022-02-22 – 2022-03-15 (×2): qty 2, 1d supply, fill #0

## 2022-03-09 ENCOUNTER — Other Ambulatory Visit: Payer: Self-pay

## 2022-03-13 ENCOUNTER — Ambulatory Visit (HOSPITAL_COMMUNITY): Payer: Medicare PPO

## 2022-03-13 ENCOUNTER — Other Ambulatory Visit: Payer: Self-pay

## 2022-03-15 ENCOUNTER — Other Ambulatory Visit: Payer: Self-pay

## 2022-03-20 ENCOUNTER — Ambulatory Visit: Payer: Self-pay | Admitting: Urology

## 2022-03-20 NOTE — Progress Notes (Deleted)
03/20/2022 2:05 PM   Lorraine Lee 08/08/1950 [id number]  Referring provider: Casilda Carls, [place],  [place] [address]  No chief complaint on file.   HPI:    PMH: Past Medical History:  Diagnosis Date   Anemia    Colon cancer ([place]) 2006   chemo tx    Diabetes mellitus without complication ([place])    Diverticulosis    Gall stone    GERD (gastroesophageal reflux disease)    h/o   Hypertension    Hypothyroidism    h/o no meds currently   Internal hemorrhoids without mention of complication    Personal history of malignant neoplasm of large intestine     Surgical History: Past Surgical History:  Procedure Laterality Date   CESAREAN SECTION     x4   CHOLECYSTECTOMY N/A 04/30/2017   Procedure: LAPAROSCOPIC CONVERTED TO OPEN CHOLECYSTECTOMY WITH INTRAOPERATIVE CHOLANGIOGRAM;  Surgeon: Christene Lye, MD;  Location: [facility];  Service: General;  Laterality: N/A;   COLON RESECTION  2005   COLONOSCOPY WITH ESOPHAGOGASTRODUODENOSCOPY (EGD)  2015   COLONOSCOPY WITH PROPOFOL N/A 04/23/2018   Procedure: COLONOSCOPY WITH PROPOFOL;  Surgeon: Virgel Manifold, MD;  Location: [facility];  Service: Endoscopy;  Laterality: N/A;   ILEOSTOMY CLOSURE     KNEE ARTHROSCOPY WITH MEDIAL MENISECTOMY Right 03/17/2014   Procedure: RIGHT ARTHROSCOPY KNEE,MEDIAL MENISECTOMY AND DEBRIDEMENT CHONDROMALACIA, ;  Surgeon: Kerin Salen, MD;  Location: [place];  Service: Orthopedics;  Laterality: Right;   UMBILICAL HERNIA REPAIR N/A 04/30/2017   Procedure: HERNIA REPAIR UMBILICAL ADULT;  Surgeon: Christene Lye, MD;  Location: [facility];  Service: General;  Laterality: N/A;    Home Medications:  Allergies as of 03/20/2022       Reactions   Sulfonamide Derivatives    REACTION: rash   Tetanus Toxoid    REACTION: fever and swelling of site        Medication List        Accurate as of March 20, 2022  2:05 PM. If you have any  questions, ask your nurse or doctor.          aspirin EC 81 MG tablet Take 81 mg by mouth daily.   calcium carbonate 500 MG chewable tablet Commonly known as: TUMS - dosed in mg elemental calcium Chew 1 tablet by mouth as needed for indigestion or heartburn.   cetirizine 10 MG tablet Commonly known as: ZYRTEC Take 1 Tablet once (at bedtime) orally as needed   LORazepam 1 MG tablet Commonly known as: ATIVAN Take 1 tablet by mouth 60 minutes before MRI and take a 2nd tablet 15 min before MRI if needed.   losartan 25 MG tablet Commonly known as: COZAAR Take 25 mg by mouth every morning.   metFORMIN 500 MG tablet Commonly known as: GLUCOPHAGE Take by mouth daily with breakfast.   multivitamin tablet Take 1 tablet by mouth daily.   omeprazole 40 MG capsule Commonly known as: PRILOSEC Take 1 capsule (40 mg total) by mouth daily.   rosuvastatin 20 MG tablet Commonly known as: CRESTOR Take 20 mg by mouth every morning.   Trilipix 135 MG capsule Generic drug: Choline Fenofibrate Take 135 mg by mouth every morning.   venlafaxine XR 150 MG 24 hr capsule Commonly known as: EFFEXOR-XR Take 150 mg by mouth daily with breakfast.   VITAMIN D PO Take 5,000 Units by mouth daily.        Allergies:  Allergies  Allergen Reactions   Sulfonamide Derivatives     REACTION: rash   Tetanus Toxoid     REACTION: fever and swelling of site    Family History: Family History  Problem Relation Age of Onset   Diabetes Father    Heart disease Father    Colon cancer Maternal Uncle     Social History:  reports that she has never smoked. She has never used smokeless tobacco. She reports that she does not drink alcohol and does not use drugs.   Physical Exam: There were no vitals taken for this visit.  Constitutional:  Alert and oriented, No acute distress. HEENT: [place] AT, moist mucus membranes.  Trachea midline, no masses. Cardiovascular: No clubbing, cyanosis, or  edema. Respiratory: Normal respiratory effort, no increased work of breathing. GI: Abdomen is soft, nontender, nondistended, no abdominal masses GU: No CVA tenderness Skin: No rashes, bruises or suspicious lesions. Neurologic: Grossly intact, no focal deficits, moving all 4 extremities. Psychiatric: Normal mood and affect.  Laboratory Data: Lab Results  Component Value Date   WBC 5.1 11/02/2021   HGB 13.7 11/02/2021   HCT 40.9 11/02/2021   MCV 92.7 11/02/2021   PLT 241 11/02/2021    Lab Results  Component Value Date   CREATININE 0.86 05/08/2021    No results found for: "PSA"  No results found for: "TESTOSTERONE"  Lab Results  Component Value Date   HGBA1C 8.9 (H) 05/08/2021    Urinalysis No results found for: "COLORURINE", "APPEARANCEUR", "LABSPEC", "PHURINE", "GLUCOSEU", "HGBUR", "BILIRUBINUR", "KETONESUR", "PROTEINUR", "UROBILINOGEN", "NITRITE", "LEUKOCYTESUR"  No results found for: "LABMICR", "WBCUA", "RBCUA", "LABEPIT", "MUCUS", "BACTERIA"  Pertinent Imaging: *** No results found for this or any previous visit.  No results found for this or any previous visit.  No results found for this or any previous visit.  No results found for this or any previous visit.  No results found for this or any previous visit.  No valid procedures specified. No results found for this or any previous visit.  No results found for this or any previous visit.   Assessment & Plan:    There are no diagnoses linked to this encounter.  No follow-ups on file.  Hollice Espy, MD  [facility] [address] [place], [place] [address] [phone]

## 2022-03-21 ENCOUNTER — Ambulatory Visit: Payer: Self-pay | Admitting: Urology

## 2022-03-21 ENCOUNTER — Other Ambulatory Visit: Payer: Self-pay

## 2022-03-21 ENCOUNTER — Encounter: Payer: Self-pay | Admitting: Gastroenterology

## 2022-03-21 ENCOUNTER — Ambulatory Visit: Payer: Medicare PPO | Admitting: Gastroenterology

## 2022-03-21 VITALS — BP 124/75 | HR 80 | Temp 98.3°F | Ht 65.0 in | Wt 188.1 lb

## 2022-03-21 DIAGNOSIS — Z85038 Personal history of other malignant neoplasm of large intestine: Secondary | ICD-10-CM

## 2022-03-21 DIAGNOSIS — Z86010 Personal history of colonic polyps: Secondary | ICD-10-CM | POA: Diagnosis not present

## 2022-03-21 DIAGNOSIS — R159 Full incontinence of feces: Secondary | ICD-10-CM

## 2022-03-21 DIAGNOSIS — R152 Fecal urgency: Secondary | ICD-10-CM | POA: Diagnosis not present

## 2022-03-21 DIAGNOSIS — M2240 Chondromalacia patellae, unspecified knee: Secondary | ICD-10-CM | POA: Insufficient documentation

## 2022-03-21 MED ORDER — CLENPIQ 10-3.5-12 MG-GM -GM/175ML PO SOLN
175.0000 mL | Freq: Once | ORAL | 0 refills | Status: AC
  Filled 2022-03-21: qty 350, 1d supply, fill #0

## 2022-03-21 NOTE — Progress Notes (Deleted)
03/21/2022 9:46 AM   Lorraine Lee [birth year] [id number]  Referring provider: Casilda Carls, MD [name],  [place] [address]  No chief complaint on file.   HPI:  She is currently undergoing evaluation with Dr. Manuella Ghazi of neurology for cognitive impairment including gait and balance, multiple falls, urinary incontinence and depression.  She has prostate MRI ordered to rule out normal pressure hydrocephalus.  PMH: Past Medical History:  Diagnosis Date   Anemia    Colon cancer ([place]) 2006   chemo tx    Diabetes mellitus without complication ([place])    Diverticulosis    Gall stone    GERD (gastroesophageal reflux disease)    h/o   Hypertension    Hypothyroidism    h/o no meds currently   Internal hemorrhoids without mention of complication    Personal history of malignant neoplasm of large intestine     Surgical History: Past Surgical History:  Procedure Laterality Date   CESAREAN SECTION     x4   CHOLECYSTECTOMY N/A 04/30/2017   Procedure: LAPAROSCOPIC CONVERTED TO OPEN CHOLECYSTECTOMY WITH INTRAOPERATIVE CHOLANGIOGRAM;  Surgeon: Christene Lye, MD;  Location: [facility];  Service: General;  Laterality: N/A;   COLON RESECTION  2005   COLONOSCOPY WITH ESOPHAGOGASTRODUODENOSCOPY (EGD)  2015   COLONOSCOPY WITH PROPOFOL N/A 04/23/2018   Procedure: COLONOSCOPY WITH PROPOFOL;  Surgeon: Virgel Manifold, MD;  Location: [facility];  Service: Endoscopy;  Laterality: N/A;   ILEOSTOMY CLOSURE     KNEE ARTHROSCOPY WITH MEDIAL MENISECTOMY Right 03/17/2014   Procedure: RIGHT ARTHROSCOPY KNEE,MEDIAL MENISECTOMY AND DEBRIDEMENT CHONDROMALACIA, ;  Surgeon: Kerin Salen, MD;  Location: [place];  Service: Orthopedics;  Laterality: Right;   UMBILICAL HERNIA REPAIR N/A 04/30/2017   Procedure: HERNIA REPAIR UMBILICAL ADULT;  Surgeon: Christene Lye, MD;  Location: [facility];  Service: General;  Laterality: N/A;    Home Medications:   Allergies as of 03/21/2022       Reactions   Sulfonamide Derivatives    REACTION: rash   Tetanus Toxoid    REACTION: fever and swelling of site        Medication List        Accurate as of March 21, 2022  9:46 AM. If you have any questions, ask your nurse or doctor.          alendronate 70 MG tablet Commonly known as: FOSAMAX   aspirin EC 81 MG tablet Take 81 mg by mouth daily.   calcium carbonate 1250 (500 Ca) MG chewable tablet Commonly known as: OS-CAL Chew by mouth.   calcium carbonate 500 MG chewable tablet Commonly known as: TUMS - dosed in mg elemental calcium Chew 1 tablet by mouth as needed for indigestion or heartburn.   cetirizine 10 MG tablet Commonly known as: ZYRTEC Take 1 Tablet once (at bedtime) orally as needed   hydrochlorothiazide 25 MG tablet Commonly known as: HYDRODIURIL   LORazepam 1 MG tablet Commonly known as: ATIVAN Take 1 tablet by mouth 60 minutes before MRI and take a 2nd tablet 15 min before MRI if needed.   losartan 100 MG tablet Commonly known as: COZAAR Take 100 mg by mouth daily. What changed: Another medication with the same name was removed. Continue taking this medication, and follow the directions you see here. Changed by: Shelby Mattocks, CMA   metFORMIN 1000 MG tablet Commonly known as: GLUCOPHAGE Take by mouth. What changed: Another medication with the same name was removed. Continue taking  this medication, and follow the directions you see here. Changed by: Shelby Mattocks, CMA   methocarbamol 500 MG tablet Commonly known as: ROBAXIN Take 1 tablet twice a day by oral route.   multivitamin tablet Take 1 tablet by mouth daily.   omeprazole 40 MG capsule Commonly known as: PRILOSEC Take 1 capsule (40 mg total) by mouth daily.   OneTouch Delica Plus [id number] Misc daily.   rosuvastatin 20 MG tablet Commonly known as: CRESTOR Take 20 mg by mouth every morning.   Semaglutide 3 MG Tabs Take by mouth.    Trilipix 135 MG capsule Generic drug: Choline Fenofibrate Take 135 mg by mouth every morning.   venlafaxine XR 150 MG 24 hr capsule Commonly known as: EFFEXOR-XR Take 150 mg by mouth daily with breakfast.   VITAMIN D PO Take 5,000 Units by mouth daily.        Allergies:  Allergies  Allergen Reactions   Sulfonamide Derivatives     REACTION: rash   Tetanus Toxoid     REACTION: fever and swelling of site    Family History: Family History  Problem Relation Age of Onset   Diabetes Father    Heart disease Father    Colon cancer Maternal Uncle     Social History:  reports that she has never smoked. She has never used smokeless tobacco. She reports that she does not drink alcohol and does not use drugs.   Physical Exam: There were no vitals taken for this visit.  Constitutional:  Alert and oriented, No acute distress. HEENT: [place] AT, moist mucus membranes.  Trachea midline, no masses. Cardiovascular: No clubbing, cyanosis, or edema. Respiratory: Normal respiratory effort, no increased work of breathing. GI: Abdomen is soft, nontender, nondistended, no abdominal masses GU: No CVA tenderness Skin: No rashes, bruises or suspicious lesions. Neurologic: Grossly intact, no focal deficits, moving all 4 extremities. Psychiatric: Normal mood and affect.  Laboratory Data: Lab Results  Component Value Date   WBC 5.1 11/02/2021   HGB 13.7 11/02/2021   HCT 40.9 11/02/2021   MCV 92.7 11/02/2021   PLT 241 11/02/2021    Lab Results  Component Value Date   CREATININE 0.86 05/08/2021    No results found for: "PSA"  No results found for: "TESTOSTERONE"  Lab Results  Component Value Date   HGBA1C 8.9 (H) 05/08/2021    Urinalysis No results found for: "COLORURINE", "APPEARANCEUR", "LABSPEC", "PHURINE", "GLUCOSEU", "HGBUR", "BILIRUBINUR", "KETONESUR", "PROTEINUR", "UROBILINOGEN", "NITRITE", "LEUKOCYTESUR"  No results found for: "LABMICR", "WBCUA", "RBCUA", "LABEPIT",  "MUCUS", "BACTERIA"  Pertinent Imaging: *** No results found for this or any previous visit.  No results found for this or any previous visit.  No results found for this or any previous visit.  No results found for this or any previous visit.  No results found for this or any previous visit.  No valid procedures specified. No results found for this or any previous visit.  No results found for this or any previous visit.   Assessment & Plan:    There are no diagnoses linked to this encounter.  No follow-ups on file.  Hollice Espy, MD  [facility] [address], [place], [place] [address] [phone]

## 2022-03-21 NOTE — Progress Notes (Signed)
Cephas Darby, MD [address]  [place]  [place], [place] [address]  Main: [phone]  Fax: [phone]    Gastroenterology Consultation  Referring Provider:     Theresia Lo, NP Primary Care Physician:  Theresia Lo, NP Primary Gastroenterologist:  Dr. Cephas Darby Reason for Consultation: Fecal incontinence        HPI:   Lorraine Lee is a 71 y.o. female referred by Dr. Theresia Lo, NP  for consultation & management of fecal incontinence.  Patient reports several years history of alternating episodes of diarrhea and constipation.  She will have 2 days of not moving her bowels followed by 2-3 loose stools associated with urgency and incontinence.  This has resulted in soiling of her clothes patient is not able to attend social gatherings because of fear of fecal incontinence.  Patient denies any significant straining, abdominal distention, abdominal pain or protrusion of any soft tissue per rectum or per vaginal.  Also reports urinary incontinence, has an appointment to see urologist on 03/27/2022.  She has history of colon cancer, colon polyps, due for surveillance colonoscopy.  Patient had 4 pregnancies, underwent C-section  NSAIDs: None  Antiplts/Anticoagulants/Anti thrombotics: None  GI Procedures:  Colonoscopy 04/23/2018 - Three 4 to 6 mm polyps in the ascending colon and in the cecum, removed with a cold snare. Resected and retrieved. - One 3 mm polyp in the ascending colon, removed with a cold biopsy forceps. Resected and retrieved. - One 4 mm polyp in the sigmoid colon, removed with a cold biopsy forceps. Resected and retrieved. - Diverticulosis in the sigmoid colon and in the ascending colon. - Patent surgical anastomosis, characterized by healthy appearing mucosa. - The examination was otherwise normal. - The rectum, sigmoid colon, descending colon, transverse colon, ascending colon and cecum are normal. - Non-bleeding internal  hemorrhoids.  Past Medical History:  Diagnosis Date   Anemia    Colon cancer ([place]) 2006   chemo tx    Diabetes mellitus without complication ([place])    Diverticulosis    Gall stone    GERD (gastroesophageal reflux disease)    h/o   Hypertension    Hypothyroidism    h/o no meds currently   Internal hemorrhoids without mention of complication    Personal history of malignant neoplasm of large intestine     Past Surgical History:  Procedure Laterality Date   CESAREAN SECTION     x4   CHOLECYSTECTOMY N/A 04/30/2017   Procedure: LAPAROSCOPIC CONVERTED TO OPEN CHOLECYSTECTOMY WITH INTRAOPERATIVE CHOLANGIOGRAM;  Surgeon: Christene Lye, MD;  Location: [facility];  Service: General;  Laterality: N/A;   COLON RESECTION  2005   COLONOSCOPY WITH ESOPHAGOGASTRODUODENOSCOPY (EGD)  2015   COLONOSCOPY WITH PROPOFOL N/A 04/23/2018   Procedure: COLONOSCOPY WITH PROPOFOL;  Surgeon: Virgel Manifold, MD;  Location: [facility];  Service: Endoscopy;  Laterality: N/A;   ILEOSTOMY CLOSURE     KNEE ARTHROSCOPY WITH MEDIAL MENISECTOMY Right 03/17/2014   Procedure: RIGHT ARTHROSCOPY KNEE,MEDIAL MENISECTOMY AND DEBRIDEMENT CHONDROMALACIA, ;  Surgeon: Kerin Salen, MD;  Location: [place];  Service: Orthopedics;  Laterality: Right;   UMBILICAL HERNIA REPAIR N/A 04/30/2017   Procedure: HERNIA REPAIR UMBILICAL ADULT;  Surgeon: Christene Lye, MD;  Location: [facility];  Service: General;  Laterality: N/A;     Current Outpatient Medications:    aspirin EC 81 MG tablet, Take 81 mg by mouth daily., Disp: , Rfl:    calcium carbonate (TUMS - DOSED  IN MG ELEMENTAL CALCIUM) 500 MG chewable tablet, Chew 1 tablet by mouth as needed for indigestion or heartburn., Disp: , Rfl:    Lancets (ONETOUCH DELICA PLUS [id number]) MISC, daily., Disp: , Rfl:    losartan (COZAAR) 100 MG tablet, Take 100 mg by mouth daily., Disp: , Rfl:    metFORMIN (GLUCOPHAGE) 1000 MG tablet, Take by mouth.,  Disp: , Rfl:    Multiple Vitamin (MULTIVITAMIN) tablet, Take 1 tablet by mouth daily., Disp: , Rfl:    rosuvastatin (CRESTOR) 20 MG tablet, Take 20 mg by mouth every morning. , Disp: , Rfl:    Sod Picosulfate-Mag Ox-Cit Acd (CLENPIQ) 10-3.5-12 MG-GM -GM/175ML SOLN, Take 175 mLs by mouth once for 1 dose., Disp: 350 mL, Rfl: 0   venlafaxine XR (EFFEXOR-XR) 150 MG 24 hr capsule, Take 150 mg by mouth daily with breakfast., Disp: , Rfl:    Semaglutide 3 MG TABS, Take by mouth., Disp: , Rfl:    Family History  Problem Relation Age of Onset   Diabetes Father    Heart disease Father    Colon cancer Maternal Uncle      Social History   Tobacco Use   Smoking status: Never   Smokeless tobacco: Never  Vaping Use   Vaping Use: Never used  Substance Use Topics   Alcohol use: No   Drug use: No    Allergies as of 03/21/2022 - Review Complete 03/21/2022  Allergen Reaction Noted   Sulfonamide derivatives     Tetanus toxoid      Review of Systems:    All systems reviewed and negative except where noted in HPI.   Physical Exam:  BP 124/75 (BP Location: Left Arm, Patient Position: Sitting, Cuff Size: Normal)   Pulse 80   Temp 98.3 F (36.8 C) (Oral)   Ht '5\' 5"'$  (1.651 m)   Wt 188 lb 2 oz (85.3 kg)   BMI 31.31 kg/m  No LMP recorded. Patient is postmenopausal.  General:   Alert,  Well-developed, well-nourished, pleasant and cooperative in NAD Head:  Normocephalic and atraumatic. Eyes:  Sclera clear, no icterus.   Conjunctiva pink. Ears:  Normal auditory acuity. Nose:  No deformity, discharge, or lesions. Mouth:  No deformity or lesions,oropharynx pink & moist. Neck:  Supple; no masses or thyromegaly. Lungs:  Respirations even and unlabored.  Clear throughout to auscultation.   No wheezes, crackles, or rhonchi. No acute distress. Heart:  Regular rate and rhythm; no murmurs, clicks, rubs, or gallops. Abdomen:  Normal bowel sounds. Soft, non-tender and non-distended without masses,  hepatosplenomegaly or hernias noted.  No guarding or rebound tenderness.   Rectal: Not performed Msk:  Symmetrical without gross deformities. Good, equal movement & strength bilaterally. Pulses:  Normal pulses noted. Extremities:  No clubbing or edema.  No cyanosis. Neurologic:  Alert and oriented x3;  grossly normal neurologically. Skin:  Intact without significant lesions or rashes. No jaundice. Psych:  Alert and cooperative. Normal mood and affect.  Imaging Studies: Reviewed  Assessment and Plan:   Lorraine Lee is a 71 y.o. 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

## 2022-03-21 NOTE — Patient Instructions (Signed)
Kegel Exercises  Kegel exercises can help strengthen your pelvic floor muscles. The pelvic floor is a group of muscles that support your rectum, small intestine, and bladder. In females, pelvic floor muscles also help support the uterus. These muscles help you control the flow of urine and stool (feces). Kegel exercises are painless and simple. They do not require any equipment. Your provider may suggest Kegel exercises to: Improve bladder and bowel control. Improve sexual response. Improve weak pelvic floor muscles after surgery to remove the uterus (hysterectomy) or after pregnancy, in females. Improve weak pelvic floor muscles after prostate gland removal or surgery, in males. Kegel exercises involve squeezing your pelvic floor muscles. These are the same muscles you squeeze when you try to stop the flow of urine or keep from passing gas. The exercises can be done while sitting, standing, or lying down, but it is best to vary your position. Ask your health care provider which exercises are safe for you. Do exercises exactly as told by your health care provider and adjust them as directed. Do not begin these exercises until told by your health care provider. Exercises How to do Kegel exercises: Squeeze your pelvic floor muscles tight. You should feel a tight lift in your rectal area. If you are a female, you should also feel a tightness in your vaginal area. Keep your stomach, buttocks, and legs relaxed. Hold the muscles tight for up to 10 seconds. Breathe normally. Relax your muscles for up to 10 seconds. Repeat as told by your health care provider. Repeat this exercise daily as told by your health care provider. Continue to do this exercise for at least 4-6 weeks, or for as long as told by your health care provider. You may be referred to a physical therapist who can help you learn more about how to do Kegel exercises. Depending on your condition, your health care provider may  recommend: Varying how long you squeeze your muscles. Doing several sets of exercises every day. Doing exercises for several weeks. Making Kegel exercises a part of your regular exercise routine. This information is not intended to replace advice given to you by your health care provider. Make sure you discuss any questions you have with your health care provider. Document Revised: 09/22/2020 Document Reviewed: 09/22/2020 Elsevier Patient Education  [place]. High-Fiber Eating Plan Fiber, also called dietary fiber, is a type of carbohydrate. It is found foods such as fruits, vegetables, whole grains, and beans. A high-fiber diet can have many health benefits. Your health care provider may recommend a high-fiber diet to help: Prevent constipation. Fiber can make your bowel movements more regular. Lower your cholesterol. Relieve the following conditions: Inflammation of veins in the anus (hemorrhoids). Inflammation of specific areas of the digestive tract (uncomplicated diverticulosis). A problem of the large intestine, also called the colon, that sometimes causes pain and diarrhea (irritable bowel syndrome, or IBS). Prevent overeating as part of a weight-loss plan. Prevent heart disease, type 2 diabetes, and certain cancers. What are tips for following this plan? Reading food labels  Check the nutrition facts label on food products for the amount of dietary fiber. Choose foods that have 5 grams of fiber or more per serving. The goals for recommended daily fiber intake include: Men (age 23 or younger): 34-38 g. Men (over age 78): 28-34 g. Women (age 68 or younger): 25-28 g. Women (over age 58): 22-25 g. Your daily fiber goal is _____________ g. Shopping Choose whole fruits and vegetables instead of  processed forms, such as apple juice or applesauce. Choose a wide variety of high-fiber foods such as avocados, lentils, oats, and kidney beans. Read the nutrition facts label of the  foods you choose. Be aware of foods with added fiber. These foods often have high sugar and sodium amounts per serving. Cooking Use whole-grain flour for baking and cooking. Cook with brown rice instead of white rice. Meal planning Start the day with a breakfast that is high in fiber, such as a cereal that contains 5 g of fiber or more per serving. Eat breads and cereals that are made with whole-grain flour instead of refined flour or white flour. Eat brown rice, bulgur wheat, or millet instead of white rice. Use beans in place of meat in soups, salads, and pasta dishes. Be sure that half of the grains you eat each day are whole grains. General information You can get the recommended daily intake of dietary fiber by: Eating a variety of fruits, vegetables, grains, nuts, and beans. Taking a fiber supplement if you are not able to take in enough fiber in your diet. It is better to get fiber through food than from a supplement. Gradually increase how much fiber you consume. If you increase your intake of dietary fiber too quickly, you may have bloating, cramping, or gas. Drink plenty of water to help you digest fiber. Choose high-fiber snacks, such as berries, raw vegetables, nuts, and popcorn. What foods should I eat? Fruits Berries. Pears. Apples. Oranges. Avocado. Prunes and raisins. Dried figs. Vegetables Sweet potatoes. Spinach. Kale. Artichokes. Cabbage. Broccoli. Cauliflower. Green peas. Carrots. Squash. Grains Whole-grain breads. Multigrain cereal. Oats and oatmeal. Brown rice. Barley. Bulgur wheat. [place]. Quinoa. Bran muffins. Popcorn. Rye wafer crackers. Meats and other proteins Navy beans, kidney beans, and pinto beans. Soybeans. Split peas. Lentils. Nuts and seeds. Dairy Fiber-fortified yogurt. Beverages Fiber-fortified soy milk. Fiber-fortified orange juice. Other foods Fiber bars. The items listed above may not be a complete list of recommended foods and beverages. Contact  a dietitian for more information. What foods should I avoid? Fruits Fruit juice. Cooked, strained fruit. Vegetables Fried potatoes. Canned vegetables. Well-cooked vegetables. Grains White bread. Pasta made with refined flour. White rice. Meats and other proteins Fatty cuts of meat. Fried chicken or fried fish. Dairy Milk. Yogurt. Cream cheese. Sour cream. Fats and oils Butters. Beverages Soft drinks. Other foods Cakes and pastries. The items listed above may not be a complete list of foods and beverages to avoid. Talk with your dietitian about what choices are best for you. Summary Fiber is a type of carbohydrate. It is found in foods such as fruits, vegetables, whole grains, and beans. A high-fiber diet has many benefits. It can help to prevent constipation, lower blood cholesterol, aid weight loss, and reduce your risk of heart disease, diabetes, and certain cancers. Increase your intake of fiber gradually. Increasing fiber too quickly may cause cramping, bloating, and gas. Drink plenty of water while you increase the amount of fiber you consume. The best sources of fiber include whole fruits and vegetables, whole grains, nuts, seeds, and beans. This information is not intended to replace advice given to you by your health care provider. Make sure you discuss any questions you have with your health care provider. Document Revised: 09/17/2019 Document Reviewed: 09/17/2019 Elsevier Patient Education  [place].

## 2022-03-22 ENCOUNTER — Other Ambulatory Visit: Payer: Self-pay

## 2022-03-23 ENCOUNTER — Ambulatory Visit (HOSPITAL_COMMUNITY)
Admission: RE | Admit: 2022-03-23 | Discharge: 2022-03-23 | Disposition: A | Discharge status: Home / Self Care | Payer: Medicare PPO | Source: Ambulatory Visit | Attending: Neurology | Admitting: Neurology

## 2022-03-23 DIAGNOSIS — R4189 Other symptoms and signs involving cognitive functions and awareness: Secondary | ICD-10-CM | POA: Diagnosis present

## 2022-03-27 ENCOUNTER — Encounter: Payer: Self-pay | Admitting: Urology

## 2022-03-27 ENCOUNTER — Ambulatory Visit: Payer: Medicare PPO | Admitting: Urology

## 2022-03-27 VITALS — BP 134/80 | HR 81 | Ht 65.0 in | Wt 188.0 lb

## 2022-03-27 DIAGNOSIS — N3941 Urge incontinence: Secondary | ICD-10-CM

## 2022-03-27 DIAGNOSIS — N3281 Overactive bladder: Secondary | ICD-10-CM

## 2022-03-27 DIAGNOSIS — R32 Unspecified urinary incontinence: Secondary | ICD-10-CM

## 2022-03-27 DIAGNOSIS — N393 Stress incontinence (female) (male): Secondary | ICD-10-CM

## 2022-03-27 LAB — URINALYSIS, COMPLETE
Bilirubin, UA: NEGATIVE
Glucose, UA: NEGATIVE
Ketones, UA: NEGATIVE
Leukocytes,UA: NEGATIVE
Nitrite, UA: NEGATIVE
Protein,UA: NEGATIVE
Specific Gravity, UA: 1.02 (ref 1.005–1.030)
Urobilinogen, Ur: 0.2 mg/dL (ref 0.2–1.0)
pH, UA: 7 (ref 5.0–7.5)

## 2022-03-27 LAB — MICROSCOPIC EXAMINATION

## 2022-03-27 LAB — BLADDER SCAN AMB NON-IMAGING: Scan Result: 35

## 2022-03-27 MED ORDER — GEMTESA 75 MG PO TABS: 75.0000 mg | ORAL_TABLET | Freq: Every day | ORAL | 0 refills | Status: DC

## 2022-03-27 NOTE — Progress Notes (Signed)
03/27/2022 7:59 PM   Lorraine Lee 05-24-1951 [id number]  Referring provider: Casilda Carls, MD [place],  [place] [address]  Chief Complaint  Patient presents with   New Patient (Initial Visit)    HPI: 71 year old female who presents today for further evaluation of urinary incontinence.  She reports over the past 2 years, she has had progressive issues with urinary incontinence.  This is both when she laughs coughs sneezes, lifts something heavy bends over, etc. as well as urgency frequency and feel like she needed to the bathroom in time.  She also has episodes of gross urge incontinence.  She feels like she cannot go to parties and limits her activity because of these issues.  She is embarrassed and concerned.  She is currently undergoing evaluation with Dr. Manuella Ghazi of neurology for cognitive impairment including gait and balance, multiple falls, urinary incontinence and depression.  She has prostate MRI ordered to rule out normal pressure hydrocephalus.  This was performed but not yet read.    She is also seeing Dr. Marius Ditch for fecal incontinence.  She has been referred to pelvid floor PT as well as scheduled for colonoscopy.  She does report severe chronic constipation at times.  She does have DM, on semaglutide.    4 previous pregnancies.  No vaginal symptoms.  PVR 56 cc  PMH: Past Medical History:  Diagnosis Date   Anemia    Colon cancer ([place]) 2006   chemo tx    Diabetes mellitus without complication ([facility])    Diverticulosis    Gall stone    GERD (gastroesophageal reflux disease)    h/o   Hypertension    Hypothyroidism    h/o no meds currently   Internal hemorrhoids without mention of complication    Personal history of malignant neoplasm of large intestine     Surgical History: Past Surgical History:  Procedure Laterality Date   CESAREAN SECTION     x4   CHOLECYSTECTOMY N/A 04/30/2017   Procedure: LAPAROSCOPIC CONVERTED TO OPEN CHOLECYSTECTOMY  WITH INTRAOPERATIVE CHOLANGIOGRAM;  Surgeon: Christene Lye, MD;  Location: [facility];  Service: General;  Laterality: N/A;   COLON RESECTION  2005   COLONOSCOPY WITH ESOPHAGOGASTRODUODENOSCOPY (EGD)  2015   COLONOSCOPY WITH PROPOFOL N/A 04/23/2018   Procedure: COLONOSCOPY WITH PROPOFOL;  Surgeon: Virgel Manifold, MD;  Location: [facility];  Service: Endoscopy;  Laterality: N/A;   ILEOSTOMY CLOSURE     KNEE ARTHROSCOPY WITH MEDIAL MENISECTOMY Right 03/17/2014   Procedure: RIGHT ARTHROSCOPY KNEE,MEDIAL MENISECTOMY AND DEBRIDEMENT CHONDROMALACIA, ;  Surgeon: Kerin Salen, MD;  Location: [facility];  Service: Orthopedics;  Laterality: Right;   UMBILICAL HERNIA REPAIR N/A 04/30/2017   Procedure: HERNIA REPAIR UMBILICAL ADULT;  Surgeon: Christene Lye, MD;  Location: [facility];  Service: General;  Laterality: N/A;    Home Medications:  Allergies as of 03/27/2022       Reactions   Sulfonamide Derivatives    REACTION: rash   Tetanus Toxoid    REACTION: fever and swelling of site        Medication List        Accurate as of March 27, 2022  7:59 PM. If you have any questions, ask your nurse or doctor.          aspirin EC 81 MG tablet Take 81 mg by mouth daily.   calcium carbonate 500 MG chewable tablet Commonly known as: TUMS - dosed in mg elemental calcium Chew 1 tablet  by mouth as needed for indigestion or heartburn.   Gemtesa 75 MG Tabs Generic drug: Vibegron Take 75 mg by mouth daily. Started by: Hollice Espy, MD   losartan 100 MG tablet Commonly known as: COZAAR Take 100 mg by mouth daily.   metFORMIN 1000 MG tablet Commonly known as: GLUCOPHAGE Take by mouth.   multivitamin tablet Take 1 tablet by mouth daily.   OneTouch Delica Plus [id number] Misc daily.   rosuvastatin 20 MG tablet Commonly known as: CRESTOR Take 20 mg by mouth every morning.   Semaglutide 3 MG Tabs Take by mouth.   venlafaxine XR 150 MG 24 hr  capsule Commonly known as: EFFEXOR-XR Take 150 mg by mouth daily with breakfast.        Allergies:  Allergies  Allergen Reactions   Sulfonamide Derivatives     REACTION: rash   Tetanus Toxoid     REACTION: fever and swelling of site    Family History: Family History  Problem Relation Age of Onset   Diabetes Father    Heart disease Father    Colon cancer Maternal Uncle     Social History:  reports that she has never smoked. She has never used smokeless tobacco. She reports that she does not drink alcohol and does not use drugs.   Physical Exam: BP 134/80   Pulse 81   Ht '5\' 5"'$  (1.651 m)   Wt 188 lb (85.3 kg)   BMI 31.28 kg/m   Constitutional:  Alert and oriented, No acute distress. HEENT: [place] AT, moist mucus membranes.  Trachea midline, no masses. Cardiovascular: No clubbing, cyanosis, or edema. Respiratory: Normal respiratory effort, no increased work of breathing. Skin: No rashes, bruises or suspicious lesions. Neurologic: Grossly intact, no focal deficits, moving all 4 extremities. Psychiatric: Normal mood and affect.  Laboratory Data: Lab Results  Component Value Date   WBC 5.1 11/02/2021   HGB 13.7 11/02/2021   HCT 40.9 11/02/2021   MCV 92.7 11/02/2021   PLT 241 11/02/2021    Lab Results  Component Value Date   CREATININE 0.86 05/08/2021   Lab Results  Component Value Date   HGBA1C 8.9 (H) 05/08/2021    Urinalysis Results for orders placed or performed in visit on 03/27/22  Microscopic Examination   Urine  Result Value Ref Range   WBC, UA 0-5 0 - 5 /hpf   RBC, Urine 0-2 0 - 2 /hpf   Epithelial Cells (non renal) 0-10 0 - 10 /hpf   Mucus, UA Present (A) Not Estab.   Bacteria, UA Few None seen/Few  Urinalysis, Complete  Result Value Ref Range   Specific Gravity, UA 1.020 1.005 - 1.030   pH, UA 7.0 5.0 - 7.5   Color, UA Yellow Yellow   Appearance Ur Clear Clear   Leukocytes,UA Negative Negative   Protein,UA Negative Negative/Trace    Glucose, UA Negative Negative   Ketones, UA Negative Negative   RBC, UA Trace (A) Negative   Bilirubin, UA Negative Negative   Urobilinogen, Ur 0.2 0.2 - 1.0 mg/dL   Nitrite, UA Negative Negative   Microscopic Examination See below:   Bladder Scan (Post Void Residual) in office  Result Value Ref Range   Scan Result 35    Assessment & Plan:    1. Urge incontinence Personal history of worsening urgency and urge incontinence  Urinalysis today is negative, no evidence of infection is contributing factor.  PVR is minimal, emptying bladder well.  He is never previously tried medications, given  her history of severe chronic constipation neurological issues, I recommended consideration of Gemtesa 75 mg.  She was given 6 weeks of samples today.  We discussed possible side effects.  Risk and benefits were discussed.  She will return in 6 weeks.  Let us know how this is effective.  She will be traveling for the next several weeks. - Urinalysis, Complete - Bladder Scan (Post Void Residual) in office  2. OAB (overactive bladder) As above  3. Stress incontinence of urine Agree with referral to physical therapy both for fecal incontinence as well as stress urinary incontinence   Return in about 6 weeks (around 05/08/2022) for PVR with PA.  Hollice Espy, MD  [facility] [address] [place], [place] [address] [phone]

## 2022-05-03 ENCOUNTER — Telehealth: Payer: Self-pay

## 2022-05-03 NOTE — Telephone Encounter (Signed)
Endo called to let us know that patient did not know about her colonoscopy that is schedule for tomorrow she has eaten today. Called patient and patient states she would like to move it to 05/15/2022 called and informed endo of the change sent new instructions

## 2022-05-07 NOTE — Progress Notes (Deleted)
05/08/2022 1:01 PM   Vito [place] June 02, 1950 [id number]  Referring provider: Casilda Carls, [place],  [place] [address]  Urological history: 1. Urge incontinence -contributing factors of age, diabetes, obesity, depression, benzodiazapine and HTN -PVR ***  2. OAB -contributing factors of age, diabetes, obesity, depression, benzodiazapine and HTN  3. SUI -contributing factors of age, diabetes, fecal incontinence, obesity, depression, benzodiazapine, vaginal births and HTN -PT  No chief complaint on file.   HPI: Lorraine Lee is a 71 y.o. female who presents today for 6 weeks follow up after a trial of Gemtesa 75 mg.   PVR ***   PMH: Past Medical History:  Diagnosis Date   Anemia    Colon cancer ([place]) 2006   chemo tx    Diabetes mellitus without complication ([place])    Diverticulosis    Gall stone    GERD (gastroesophageal reflux disease)    h/o   Hypertension    Hypothyroidism    h/o no meds currently   Internal hemorrhoids without mention of complication    Personal history of malignant neoplasm of large intestine     Surgical History: Past Surgical History:  Procedure Laterality Date   CESAREAN SECTION     x4   CHOLECYSTECTOMY N/A 04/30/2017   Procedure: LAPAROSCOPIC CONVERTED TO OPEN CHOLECYSTECTOMY WITH INTRAOPERATIVE CHOLANGIOGRAM;  Surgeon: Christene Lye, MD;  Location: [facility];  Service: General;  Laterality: N/A;   COLON RESECTION  2005   COLONOSCOPY WITH ESOPHAGOGASTRODUODENOSCOPY (EGD)  2015   COLONOSCOPY WITH PROPOFOL N/A 04/23/2018   Procedure: COLONOSCOPY WITH PROPOFOL;  Surgeon: Virgel Manifold, MD;  Location: [facility];  Service: Endoscopy;  Laterality: N/A;   ILEOSTOMY CLOSURE     KNEE ARTHROSCOPY WITH MEDIAL MENISECTOMY Right 03/17/2014   Procedure: RIGHT ARTHROSCOPY KNEE,MEDIAL MENISECTOMY AND DEBRIDEMENT CHONDROMALACIA, ;  Surgeon: Kerin Salen, MD;  Location: [facility];  Service:  Orthopedics;  Laterality: Right;   UMBILICAL HERNIA REPAIR N/A 04/30/2017   Procedure: HERNIA REPAIR UMBILICAL ADULT;  Surgeon: Christene Lye, MD;  Location: [facility];  Service: General;  Laterality: N/A;    Home Medications:  Allergies as of 05/08/2022       Reactions   Sulfonamide Derivatives    REACTION: rash   Tetanus Toxoid    REACTION: fever and swelling of site        Medication List        Accurate as of May 07, 2022  1:01 PM. If you have any questions, ask your nurse or doctor.          aspirin EC 81 MG tablet Take 81 mg by mouth daily.   calcium carbonate 500 MG chewable tablet Commonly known as: TUMS - dosed in mg elemental calcium Chew 1 tablet by mouth as needed for indigestion or heartburn.   Gemtesa 75 MG Tabs Generic drug: Vibegron Take 75 mg by mouth daily.   losartan 100 MG tablet Commonly known as: COZAAR Take 100 mg by mouth daily.   metFORMIN 1000 MG tablet Commonly known as: GLUCOPHAGE Take by mouth.   multivitamin tablet Take 1 tablet by mouth daily.   OneTouch Delica Plus [id number] Misc daily.   rosuvastatin 20 MG tablet Commonly known as: CRESTOR Take 20 mg by mouth every morning.   Semaglutide 3 MG Tabs Take by mouth.   venlafaxine XR 150 MG 24 hr capsule Commonly known as: EFFEXOR-XR Take 150 mg by mouth daily with breakfast.  Allergies:  Allergies  Allergen Reactions   Sulfonamide Derivatives     REACTION: rash   Tetanus Toxoid     REACTION: fever and swelling of site    Family History: Family History  Problem Relation Age of Onset   Diabetes Father    Heart disease Father    Colon cancer Maternal Uncle     Social History:  reports that she has never smoked. She has never used smokeless tobacco. She reports that she does not drink alcohol and does not use drugs.  ROS: Pertinent ROS in HPI  Physical Exam: There were no vitals taken for this visit.  Constitutional:  Well nourished.  Alert and oriented, No acute distress. HEENT: [employer] AT, moist mucus membranes.  Trachea midline, no masses. Cardiovascular: No clubbing, cyanosis, or edema. Respiratory: Normal respiratory effort, no increased work of breathing. GU: No CVA tenderness.  No bladder fullness or masses. Vulvovaginal atrophy w/ pallor, loss of rugae, introital retraction, excoriations.  Vulvar thinning, fusion of labia, clitoral hood retraction, prominent urethral meatus.   *** external genitalia, *** pubic hair distribution, no lesions.  Normal urethral meatus, no lesions, no prolapse, no discharge.   No urethral masses, tenderness and/or tenderness. No bladder fullness, tenderness or masses. *** vagina mucosa, *** estrogen effect, no discharge, no lesions, *** pelvic support, *** cystocele and *** rectocele noted.  No cervical motion tenderness.  Uterus is freely mobile and non-fixed.  No adnexal/parametria masses or tenderness noted.  Anus and perineum are without rashes or lesions.   ***  Neurologic: Grossly intact, no focal deficits, moving all 4 extremities. Psychiatric: Normal mood and affect.    Laboratory Data: N/A  Pertinent Imaging: ***  Assessment & Plan:  ***  1. Urge incontinence ***  2. OAB ***  3. SUI ***  No follow-ups on file.  These notes generated with voice recognition software. I apologize for typographical errors.  [place], [place] [address]  [place], [place] [address] [phone]

## 2022-05-08 ENCOUNTER — Ambulatory Visit: Payer: Medicare PPO | Admitting: Urology

## 2022-05-08 ENCOUNTER — Encounter: Payer: Self-pay | Admitting: Urology

## 2022-05-08 DIAGNOSIS — N3281 Overactive bladder: Secondary | ICD-10-CM

## 2022-05-08 DIAGNOSIS — N393 Stress incontinence (female) (male): Secondary | ICD-10-CM

## 2022-05-08 DIAGNOSIS — N3941 Urge incontinence: Secondary | ICD-10-CM

## 2022-05-15 ENCOUNTER — Ambulatory Visit
Admission: RE | Admit: 2022-05-15 | Discharge: 2022-05-15 | Disposition: A | Discharge status: Home / Self Care | Payer: Medicare PPO | Attending: Gastroenterology | Admitting: Gastroenterology

## 2022-05-15 ENCOUNTER — Other Ambulatory Visit: Payer: Self-pay

## 2022-05-15 ENCOUNTER — Ambulatory Visit: Payer: Medicare PPO | Admitting: Anesthesiology

## 2022-05-15 ENCOUNTER — Encounter
Admission: RE | Disposition: A | Discharge status: Home / Self Care | Payer: Self-pay | Source: Home / Self Care | Attending: Gastroenterology

## 2022-05-15 ENCOUNTER — Encounter: Payer: Self-pay | Admitting: Gastroenterology

## 2022-05-15 DIAGNOSIS — K644 Residual hemorrhoidal skin tags: Secondary | ICD-10-CM

## 2022-05-15 DIAGNOSIS — K635 Polyp of colon: Secondary | ICD-10-CM | POA: Diagnosis not present

## 2022-05-15 DIAGNOSIS — D120 Benign neoplasm of cecum: Secondary | ICD-10-CM | POA: Diagnosis not present

## 2022-05-15 DIAGNOSIS — E119 Type 2 diabetes mellitus without complications: Secondary | ICD-10-CM | POA: Diagnosis not present

## 2022-05-15 DIAGNOSIS — K5730 Diverticulosis of large intestine without perforation or abscess without bleeding: Secondary | ICD-10-CM | POA: Diagnosis not present

## 2022-05-15 DIAGNOSIS — I10 Essential (primary) hypertension: Secondary | ICD-10-CM | POA: Diagnosis not present

## 2022-05-15 DIAGNOSIS — Z1211 Encounter for screening for malignant neoplasm of colon: Secondary | ICD-10-CM

## 2022-05-15 DIAGNOSIS — Z85038 Personal history of other malignant neoplasm of large intestine: Secondary | ICD-10-CM

## 2022-05-15 DIAGNOSIS — D649 Anemia, unspecified: Secondary | ICD-10-CM | POA: Diagnosis not present

## 2022-05-15 DIAGNOSIS — D759 Disease of blood and blood-forming organs, unspecified: Secondary | ICD-10-CM | POA: Diagnosis not present

## 2022-05-15 HISTORY — PX: COLONOSCOPY WITH PROPOFOL: SHX5780

## 2022-05-15 LAB — GLUCOSE, CAPILLARY: Glucose-Capillary: 155 mg/dL — ABNORMAL HIGH (ref 70–99)

## 2022-05-15 SURGERY — COLONOSCOPY WITH PROPOFOL
Anesthesia: General

## 2022-05-15 MED ORDER — PROPOFOL 500 MG/50ML IV EMUL
INTRAVENOUS | Status: DC | PRN
  Administered 2022-05-15: 100 ug/kg/min via INTRAVENOUS

## 2022-05-15 MED ORDER — LIDOCAINE HCL (CARDIAC) PF 100 MG/5ML IV SOSY
PREFILLED_SYRINGE | INTRAVENOUS | Status: DC | PRN
  Administered 2022-05-15: 50 mg via INTRAVENOUS

## 2022-05-15 MED ORDER — SODIUM CHLORIDE 0.9 % IV SOLN: INTRAVENOUS | Status: DC

## 2022-05-15 MED ORDER — PROPOFOL 10 MG/ML IV BOLUS
INTRAVENOUS | Status: DC | PRN
  Administered 2022-05-15: 80 mg via INTRAVENOUS

## 2022-05-15 MED ORDER — LIDOCAINE HCL (PF) 2 % IJ SOLN
INTRAMUSCULAR | Status: AC
  Filled 2022-05-15: qty 5

## 2022-05-15 NOTE — Anesthesia Preprocedure Evaluation (Signed)
Anesthesia Evaluation  Patient identified by MRN, date of birth, ID band Patient awake  General Assessment Comment:Pt had a sip of water approx 1/2 hr ago, no other recent PO intake  Reviewed: Allergy & Precautions, NPO status , Patient's Chart, lab work & pertinent test results  Airway Mallampati: III  TM Distance: >3 FB Neck ROM: full    Dental  (+) Chipped   Pulmonary neg pulmonary ROS   Pulmonary exam normal        Cardiovascular hypertension, negative cardio ROS Normal cardiovascular exam     Neuro/Psych  Neuromuscular disease  negative psych ROS   GI/Hepatic Neg liver ROS,GERD  Controlled,,  Endo/Other  diabetesHypothyroidism    Renal/GU negative Renal ROS  negative genitourinary   Musculoskeletal  (+) Arthritis ,    Abdominal   Peds  Hematology  (+) Blood dyscrasia, anemia   Anesthesia Other Findings Past Medical History: No date: Anemia 2006: Colon cancer ([place])     Comment:  chemo tx  No date: Diabetes mellitus without complication ([facility]) No date: Diverticulosis No date: Gall stone No date: GERD (gastroesophageal reflux disease)     Comment:  h/o No date: Hypertension No date: Hypothyroidism     Comment:  h/o no meds currently No date: Internal hemorrhoids without mention of complication No date: Personal history of malignant neoplasm of large intestine  Past Surgical History: No date: CESAREAN SECTION     Comment:  x4 04/30/2017: CHOLECYSTECTOMY; N/A     Comment:  Procedure: LAPAROSCOPIC CONVERTED TO OPEN               CHOLECYSTECTOMY WITH INTRAOPERATIVE CHOLANGIOGRAM;                Surgeon: Christene Lye, MD;  Location: [facility];              Service: General;  Laterality: N/A; 2005: COLON RESECTION 2015: COLONOSCOPY WITH ESOPHAGOGASTRODUODENOSCOPY (EGD) 04/23/2018: COLONOSCOPY WITH PROPOFOL; N/A     Comment:  Procedure: COLONOSCOPY WITH PROPOFOL;  Surgeon:               Virgel Manifold, MD;  Location: [facility];                Service: Endoscopy;  Laterality: N/A; No date: ILEOSTOMY CLOSURE 03/17/2014: KNEE ARTHROSCOPY WITH MEDIAL MENISECTOMY; Right     Comment:  Procedure: RIGHT ARTHROSCOPY KNEE,MEDIAL MENISECTOMY AND              DEBRIDEMENT CHONDROMALACIA, ;  Surgeon: Kerin Salen,               MD;  Location: [facility];  Service:               Orthopedics;  Laterality: Right; 02/27/7252: UMBILICAL HERNIA REPAIR; N/A     Comment:  Procedure: HERNIA REPAIR UMBILICAL ADULT;  Surgeon:               Christene Lye, MD;  Location: [facility];                Service: General;  Laterality: N/A;     Reproductive/Obstetrics negative OB ROS                             Anesthesia Physical Anesthesia Plan  ASA: 2  Anesthesia Plan: General   Post-op Pain Management:    Induction: Intravenous  PONV Risk Score and Plan: Propofol infusion  and TIVA  Airway Management Planned: Natural Airway and Nasal Cannula  Additional Equipment:   Intra-op Plan:   Post-operative Plan:   Informed Consent: I have reviewed the patients History and Physical, chart, labs and discussed the procedure including the risks, benefits and alternatives for the proposed anesthesia with the patient or authorized representative who has indicated his/her understanding and acceptance.     Dental Advisory Given  Plan Discussed with: Anesthesiologist, CRNA and Surgeon  Anesthesia Plan Comments: (Patient consented for risks of anesthesia including but not limited to:  - adverse reactions to medications - risk of airway placement if required - damage to eyes, teeth, lips or other oral mucosa - nerve damage due to positioning  - sore throat or hoarseness - Damage to heart, brain, nerves, lungs, other parts of body or loss of life  Patient voiced understanding.)        Anesthesia Quick Evaluation

## 2022-05-15 NOTE — H&P (Signed)
Lorraine Darby, MD [address]  [place]  [place], [place] [address]  Main: [phone]  Fax: [phone] Pager: [phone]  Primary Care Physician:  Casilda Carls, MD Primary Gastroenterologist:  Dr. Cephas Lee  Pre-Procedure History & Physical: HPI:  Lorraine Lee is a 71 y.o. female is here for an colonoscopy.   Past Medical History:  Diagnosis Date   Anemia    Colon cancer ([place]) 2006   chemo tx    Diabetes mellitus without complication ([place])    Diverticulosis    Gall stone    GERD (gastroesophageal reflux disease)    h/o   Hypertension    Hypothyroidism    h/o no meds currently   Internal hemorrhoids without mention of complication    Personal history of malignant neoplasm of large intestine     Past Surgical History:  Procedure Laterality Date   CESAREAN SECTION     x4   CHOLECYSTECTOMY N/A 04/30/2017   Procedure: LAPAROSCOPIC CONVERTED TO OPEN CHOLECYSTECTOMY WITH INTRAOPERATIVE CHOLANGIOGRAM;  Surgeon: Christene Lye, MD;  Location: [facility] ORS;  Service: General;  Laterality: N/A;   COLON RESECTION  2005   COLONOSCOPY WITH ESOPHAGOGASTRODUODENOSCOPY (EGD)  2015   COLONOSCOPY WITH PROPOFOL N/A 04/23/2018   Procedure: COLONOSCOPY WITH PROPOFOL;  Surgeon: Virgel Manifold, MD;  Location: [facility] END[facility];  Service: Endoscopy;  Laterality: N/A;   ILEOSTOMY CLOSURE     KNEE ARTHROSCOPY WITH MEDIAL MENISECTOMY Right 03/17/2014   Procedure: RIGHT ARTHROSCOPY KNEE,MEDIAL MENISECTOMY AND DEBRIDEMENT CHONDROMALACIA, ;  Surgeon: Kerin Salen, MD;  Location: [facility];  Service: Orthopedics;  Laterality: Right;   UMBILICAL HERNIA REPAIR N/A 04/30/2017   Procedure: HERNIA REPAIR UMBILICAL ADULT;  Surgeon: Christene Lye, MD;  Location: [facility];  Service: General;  Laterality: N/A;    Prior to Admission medications   Medication Sig Start Date End Date Taking? Authorizing Provider  aspirin EC 81 MG tablet Take 81 mg by mouth  daily. Swallow whole.   Yes [provider]  losartan (COZAAR) 100 MG tablet Take 100 mg by mouth daily. 02/22/22  Yes [provider]  metFORMIN (GLUCOPHAGE) 1000 MG tablet Take by mouth. 04/18/16  Yes [provider]  Multiple Vitamin (MULTIVITAMIN) tablet Take 1 tablet by mouth daily.   Yes [provider]  rosuvastatin (CRESTOR) 20 MG tablet Take 20 mg by mouth every morning.    Yes [provider]  venlafaxine XR (EFFEXOR-XR) 150 MG 24 hr capsule Take 150 mg by mouth daily with breakfast.   Yes [provider]  Vibegron (GEMTESA) 75 MG TABS Take 75 mg by mouth daily. 03/27/22  Yes Hollice Espy, MD  aspirin EC 81 MG tablet Take 81 mg by mouth daily. Patient not taking: Reported on 05/15/2022    [provider]  calcium carbonate (TUMS - DOSED IN MG ELEMENTAL CALCIUM) 500 MG chewable tablet Chew 1 tablet by mouth as needed for indigestion or heartburn.    [provider]  Lancets [facility] DELICA PLUS [id number]) MISC daily. 11/27/21   [provider]  Semaglutide 3 MG TABS Take by mouth.    [provider]    Allergies as of 03/22/2022 - Review Complete 03/21/2022  Allergen Reaction Noted   Sulfonamide derivatives     Tetanus toxoid      Family History  Problem Relation Age of Onset   Diabetes Father    Heart disease Father    Colon cancer Maternal Uncle  Social History   Socioeconomic History   Marital status: Married    Spouse name: Not on file   Number of children: 4   Years of education: Not on file   Highest education level: Not on file  Occupational History   Occupation: Realtor  Tobacco Use   Smoking status: Never   Smokeless tobacco: Never  Vaping Use   Vaping Use: Never used  Substance and Sexual Activity   Alcohol use: No   Drug use: No   Sexual activity: Not on file  Other Topics Concern   Not on file  Social History Narrative   Not on file   Social  Determinants of Health   Financial Resource Strain: Not on file  Food Insecurity: Not on file  Transportation Needs: Not on file  Physical Activity: Not on file  Stress: Not on file  Social Connections: Not on file  Intimate Partner Violence: Not on file    Review of Systems: See HPI, otherwise negative ROS  Physical Exam: BP 129/77   Pulse 66   Temp (!) 96.1 F (35.6 C) (Temporal)   Resp 15   Ht '5\' 5"'$  (1.651 m)   Wt 83.6 kg   SpO2 95%   BMI 30.69 kg/m  General:   Alert,  pleasant and cooperative in NAD Head:  Normocephalic and atraumatic. Neck:  Supple; no masses or thyromegaly. Lungs:  Clear throughout to auscultation.    Heart:  Regular rate and rhythm. Abdomen:  Soft, nontender and nondistended. Normal bowel sounds, without guarding, and without rebound.   Neurologic:  Alert and  oriented x4;  grossly normal neurologically.  Impression/Plan: Lorraine Lee is here for an colonoscopy to be performed for h/o colon cancer and colon adenomas  Risks, benefits, limitations, and alternatives regarding  colonoscopy have been reviewed with the patient.  Questions have been answered.  All parties agreeable.   Sherri Sear, MD  05/15/2022, 9:56 AM

## 2022-05-15 NOTE — Anesthesia Postprocedure Evaluation (Signed)
Anesthesia Post Note  Patient: Product/process development scientist  Procedure(s) Performed: COLONOSCOPY WITH PROPOFOL  Patient location during evaluation: Endoscopy Anesthesia Type: General Level of consciousness: awake and alert Pain management: pain level controlled Vital Signs Assessment: post-procedure vital signs reviewed and stable Respiratory status: spontaneous breathing, nonlabored ventilation and respiratory function stable Cardiovascular status: blood pressure returned to baseline and stable Postop Assessment: no apparent nausea or vomiting Anesthetic complications: no   No notable events documented.   Last Vitals:  Vitals:   05/15/22 1114 05/15/22 1126  BP: 104/68 115/74  Pulse: 68 (!) 55  Resp: (!) 25 15  Temp:    SpO2: 95% 97%    Last Pain:  Vitals:   05/15/22 1126  TempSrc:   PainSc: 0-No pain                 Alphonsus Sias

## 2022-05-15 NOTE — Op Note (Signed)
[facility] Patient Name: Lorraine Lee Procedure Date: 05/15/2022 10:16 AM MRN: [id number] Account #: [id number] Date of Birth: 1950-07-16 Admit Type: Outpatient Age: 71 Room: [facility] ENDO ROOM 3 Gender: Female Note Status: Finalized Instrument Name: Jasper Riling [id number] Procedure:             Colonoscopy Indications:           High risk colon cancer surveillance: Personal history                         of multiple (3 or more) adenomas, High risk colon                         cancer surveillance: Personal history of colon cancer,                         Last colonoscopy: November 2019 Providers:             Lin Landsman MD, MD Medicines:             General Anesthesia Complications:         No immediate complications. Estimated blood loss: None. Procedure:             Pre-Anesthesia Assessment:                        - Prior to the procedure, a History and Physical was                         performed, and patient medications and allergies were                         reviewed. The patient is competent. The risks and                         benefits of the procedure and the sedation options and                         risks were discussed with the patient. All questions                         were answered and informed consent was obtained.                         Patient identification and proposed procedure were                         verified by the physician, the nurse, the                         anesthesiologist, the anesthetist and the technician                         in the pre-procedure area in the procedure room in the                         endoscopy suite. Mental Status Examination: alert and  oriented. Airway Examination: normal oropharyngeal                         airway and neck mobility. Respiratory Examination:                         clear to auscultation. CV Examination: normal.                          Prophylactic Antibiotics: The patient does not require                         prophylactic antibiotics. Prior Anticoagulants: The                         patient has taken no anticoagulant or antiplatelet                         agents. ASA Grade Assessment: II - A patient with mild                         systemic disease. After reviewing the risks and                         benefits, the patient was deemed in satisfactory                         condition to undergo the procedure. The anesthesia                         plan was to use general anesthesia. Immediately prior                         to administration of medications, the patient was                         re-assessed for adequacy to receive sedatives. The                         heart rate, respiratory rate, oxygen saturations,                         blood pressure, adequacy of pulmonary ventilation, and                         response to care were monitored throughout the                         procedure. The physical status of the patient was                         re-assessed after the procedure.                        After obtaining informed consent, the colonoscope was                         passed under direct vision. Throughout the procedure,  the patient's blood pressure, pulse, and oxygen                         saturations were monitored continuously. The                         Colonoscope was introduced through the anus and                         advanced to the the cecum, identified by appendiceal                         orifice and ileocecal valve. The colonoscopy was                         performed with moderate difficulty due to significant                         looping. Successful completion of the procedure was                         aided by applying abdominal pressure. The patient                         tolerated the procedure well. The quality of the bowel                          preparation was evaluated using the BBPS [facility] Bowel                         Preparation Scale) with scores of: Right Colon = 3,                         Transverse Colon = 3 and Left Colon = 3 (entire mucosa                         seen well with no residual staining, small fragments                         of stool or opaque liquid). The total BBPS score                         equals 9. The ileocecal valve, appendiceal orifice,                         and rectum were photographed. Findings:      The perianal and digital rectal examinations were normal. Pertinent       negatives include normal sphincter tone and no palpable rectal lesions.      A diminutive polyp was found in the cecum. The polyp was sessile. The       polyp was removed with a jumbo cold forceps. Resection and retrieval       were complete. Estimated blood loss: none.      Normal mucosa was found in the entire colon. Biopsies for histology were       taken with a cold forceps from the entire colon for evaluation of  microscopic colitis. Estimated blood loss: none.      Multiple large-mouthed diverticula were found in the entire colon. There       was no evidence of diverticular bleeding.      Non-bleeding external hemorrhoids were found during retroflexion. The       hemorrhoids were medium-sized. Impression:            - One diminutive polyp in the cecum, removed with a                         jumbo cold forceps. Resected and retrieved.                        - Normal mucosa in the entire examined colon. Biopsied.                        - Severe diverticulosis in the entire examined colon.                         There was no evidence of diverticular bleeding.                        - Non-bleeding external hemorrhoids. Recommendation:        - Repeat colonoscopy in 5 years for surveillance.                        - Discharge patient to home (with escort).                        - Resume  previous diet today.                        - Continue present medications.                        - Await pathology results. Procedure Code(s):     --- Professional ---                        [phone], Colonoscopy, flexible; with biopsy, single or                         multiple Diagnosis Code(s):     --- Professional ---                        Z86.010, Personal history of colonic polyps                        Z85.038, Personal history of other malignant neoplasm                         of large intestine                        D12.0, Benign neoplasm of cecum                        K64.4, Residual hemorrhoidal skin tags                        K57.30, Diverticulosis of large intestine  without                         perforation or abscess without bleeding CPT copyright 2022 American Medical Association. All rights reserved. The codes documented in this report are preliminary and upon coder review may  be revised to meet current compliance requirements. Dr. Ulyess Mort Lin Landsman MD, MD 05/15/2022 11:01:22 AM This report has been signed electronically. Number of Addenda: 0 Note Initiated On: 05/15/2022 10:16 AM Scope Withdrawal Time: 0 hours 15 minutes 52 seconds  Total Procedure Duration: 0 hours 21 minutes 26 seconds  Estimated Blood Loss:  Estimated blood loss: none.      [facility]

## 2022-05-15 NOTE — Transfer of Care (Signed)
Immediate Anesthesia Transfer of Care Note  Patient: Lorraine Lee  Procedure(s) Performed: COLONOSCOPY WITH PROPOFOL  Patient Location: PACU and Endoscopy Unit  Anesthesia Type:General  Level of Consciousness: drowsy and patient cooperative  Airway & Oxygen Therapy: Patient Spontanous Breathing  Post-op Assessment: Report given to RN and Post -op Vital signs reviewed and stable  Post vital signs: Reviewed and stable  Last Vitals:  Vitals Value Taken Time  BP 109/68 05/15/22 1106  Temp 35.4 C 05/15/22 1101  Pulse 58 05/15/22 1106  Resp 11 05/15/22 1106  SpO2 96 % 05/15/22 1106  Vitals shown include unvalidated device data.  Last Pain:  Vitals:   05/15/22 1101  TempSrc: Temporal  PainSc: Asleep         Complications: No notable events documented.

## 2022-05-16 ENCOUNTER — Encounter: Payer: Self-pay | Admitting: Gastroenterology

## 2022-05-16 LAB — SURGICAL PATHOLOGY

## 2022-11-14 ENCOUNTER — Other Ambulatory Visit: Payer: Self-pay

## 2023-03-28 ENCOUNTER — Ambulatory Visit: Payer: Medicare PPO | Admitting: Urology

## 2023-03-28 VITALS — BP 132/82 | HR 73 | Ht 65.0 in | Wt 173.1 lb

## 2023-03-28 DIAGNOSIS — K5909 Other constipation: Secondary | ICD-10-CM

## 2023-03-28 DIAGNOSIS — R3129 Other microscopic hematuria: Secondary | ICD-10-CM | POA: Diagnosis not present

## 2023-03-28 DIAGNOSIS — R159 Full incontinence of feces: Secondary | ICD-10-CM

## 2023-03-28 DIAGNOSIS — R350 Frequency of micturition: Secondary | ICD-10-CM

## 2023-03-28 LAB — URINALYSIS, COMPLETE
Bilirubin, UA: NEGATIVE
Glucose, UA: NEGATIVE
Ketones, UA: NEGATIVE
Nitrite, UA: NEGATIVE
Protein,UA: NEGATIVE
Specific Gravity, UA: 1.02 (ref 1.005–1.030)
Urobilinogen, Ur: 1 mg/dL (ref 0.2–1.0)
pH, UA: 7.5 (ref 5.0–7.5)

## 2023-03-28 LAB — MICROSCOPIC EXAMINATION

## 2023-03-28 LAB — BLADDER SCAN AMB NON-IMAGING: Scan Result: 14

## 2023-03-28 MED ORDER — MIRABEGRON ER 50 MG PO TB24: 50.0000 mg | ORAL_TABLET | Freq: Every day | ORAL | 11 refills | Status: AC

## 2023-03-28 NOTE — Progress Notes (Signed)
Marcelle Overlie Plume,acting as a scribe for Vanna Scotland, MD.,have documented all relevant documentation on the behalf of Vanna Scotland, MD,as directed by  Vanna Scotland, MD while in the presence of Vanna Scotland, MD.  03/28/2023 11:34 AM   Lorraine Lee 02-01-1951 [id number]  Referring provider: Sherrie Mustache, MD [address] [place],  [place] [address]  Chief Complaint  Patient presents with   Over Active Bladder    HPI: 72 year-old female who presents today for a routine annual follow up.   She has urinary and fecal incontinence. At her last appointment, she was given [place]. She was working with physical therapy both for her stress and urinary incontinence at the time. She had no evidence of infection and was emptying her bladder well.   She reports a strong and severe urge to urinate, getting up more than three times a night, and leaking with laughing, coughing, and sneezing. She sometimes wears diapers and limits fluid intake, engaging in toilet mapping. She notes that nighttime urination has become more problematic and suspects that worsening diabetes may be contributing to the symptoms. She has not been drinking much water to avoid frequent urination, which has led to a cycle of constipation worsening urinary symptoms. She no longer on any OAB medication.   Her urinalysis today shows trace blood and trace leukocytes, 3-10 RBC per high powered field, and 6-10 WBC per high powered field.   She has a personal history of chronic constipation of bowel issues.  Results for orders placed or performed in visit on 03/28/23  Microscopic Examination   Urine  Result Value Ref Range   WBC, UA 6-10 (A) 0 - 5 /hpf   RBC, Urine 3-10 (A) 0 - 2 /hpf   Epithelial Cells (non renal) 0-10 0 - 10 /hpf   Bacteria, UA Few None seen/Few  Urinalysis, Complete  Result Value Ref Range   Specific Gravity, UA 1.020 1.005 - 1.030   pH, UA 7.5 5.0 - 7.5   Color, UA Yellow Yellow   Appearance Ur  Clear Clear   Leukocytes,UA Trace (A) Negative   Protein,UA Negative Negative/Trace   Glucose, UA Negative Negative   Ketones, UA Negative Negative   RBC, UA Trace (A) Negative   Bilirubin, UA Negative Negative   Urobilinogen, Ur 1.0 0.2 - 1.0 mg/dL   Nitrite, UA Negative Negative   Microscopic Examination See below:   Bladder Scan (Post Void Residual) in office  Result Value Ref Range   Scan Result 14 ml      PMH: Past Medical History:  Diagnosis Date   Anemia    Colon cancer ([facility]) 2006   chemo tx    Diabetes mellitus without complication ([facility])    Diverticulosis    Gall stone    GERD (gastroesophageal reflux disease)    h/o   Hypertension    Hypothyroidism    h/o no meds currently   Internal hemorrhoids without mention of complication    Personal history of malignant neoplasm of large intestine     Surgical History: Past Surgical History:  Procedure Laterality Date   CESAREAN SECTION     x4   CHOLECYSTECTOMY N/A 04/30/2017   Procedure: LAPAROSCOPIC CONVERTED TO OPEN CHOLECYSTECTOMY WITH INTRAOPERATIVE CHOLANGIOGRAM;  Surgeon: Kieth Brightly, MD;  Location: [facility] OR[facility];  Service: General;  Laterality: N/A;   COLON RESECTION  2005   COLONOSCOPY WITH ESOPHAGOGASTRODUODENOSCOPY (EGD)  2015   COLONOSCOPY WITH PROPOFOL N/A 04/23/2018   Procedure: COLONOSCOPY WITH PROPOFOL;  Surgeon: Pasty Spillers, MD;  Location: [facility];  Service: Endoscopy;  Laterality: N/A;   COLONOSCOPY WITH PROPOFOL N/A 05/15/2022   Procedure: COLONOSCOPY WITH PROPOFOL;  Surgeon: Toney Reil, MD;  Location: [facility] ENDOSCOPY;  Service: Gastroenterology;  Laterality: N/A;   ILEOSTOMY CLOSURE     KNEE ARTHROSCOPY WITH MEDIAL MENISECTOMY Right 03/17/2014   Procedure: RIGHT ARTHROSCOPY KNEE,MEDIAL MENISECTOMY AND DEBRIDEMENT CHONDROMALACIA, ;  Surgeon: Nestor Lewandowsky, MD;  Location: [facility];  Service: Orthopedics;  Laterality: Right;   UMBILICAL HERNIA REPAIR N/A  04/30/2017   Procedure: HERNIA REPAIR UMBILICAL ADULT;  Surgeon: Kieth Brightly, MD;  Location: [facility];  Service: General;  Laterality: N/A;    Home Medications:  Allergies as of 03/28/2023       Reactions   Sulfonamide Derivatives    REACTION: rash   Tetanus Toxoid    REACTION: fever and swelling of site        Medication List        Accurate as of March 28, 2023 11:34 AM. If you have any questions, ask your nurse or doctor.          STOP taking these medications    Gemtesa 75 MG Tabs Generic drug: Vibegron       TAKE these medications    aspirin EC 81 MG tablet Take 81 mg by mouth daily. Swallow whole.   calcium carbonate 500 MG chewable tablet Commonly known as: TUMS - dosed in mg elemental calcium Chew 1 tablet by mouth as needed for indigestion or heartburn.   losartan 25 MG tablet Commonly known as: COZAAR Take 25 mg by mouth daily. What changed: Another medication with the same name was removed. Continue taking this medication, and follow the directions you see here.   magnesium 30 MG tablet   metFORMIN 1000 MG tablet Commonly known as: GLUCOPHAGE Take 1,000 mg by mouth 2 (two) times daily with a meal.   mirabegron ER 50 MG Tb24 tablet Commonly known as: MYRBETRIQ Take 1 tablet (50 mg total) by mouth daily.   multivitamin tablet Take 1 tablet by mouth daily.   OMEGA 3 PO Take by mouth.   OneTouch Delica Plus Lancet30G Misc daily.   rosuvastatin 20 MG tablet Commonly known as: CRESTOR Take 20 mg by mouth every morning.   Rybelsus 7 MG Tabs Generic drug: Semaglutide Take by mouth. What changed: Another medication with the same name was removed. Continue taking this medication, and follow the directions you see here.   venlafaxine XR 150 MG 24 hr capsule Commonly known as: EFFEXOR-XR Take 150 mg by mouth daily with breakfast.        Allergies:  Allergies  Allergen Reactions   Sulfonamide Derivatives     REACTION:  rash   Tetanus Toxoid     REACTION: fever and swelling of site    Family History: Family History  Problem Relation Age of Onset   Diabetes Father    Heart disease Father    Colon cancer Maternal Uncle     Social History:  reports that she has never smoked. She has never used smokeless tobacco. She reports that she does not drink alcohol and does not use drugs.   Physical Exam: BP 132/82   Pulse 73   Ht 5\' 5"  (1.651 m)   Wt 173 lb 2 oz (78.5 kg)   BMI 28.81 kg/m   Constitutional:  Alert and oriented, No acute distress. HEENT: [place] AT, moist mucus membranes.  Trachea midline, no masses. Neurologic: Grossly intact, no focal deficits, moving all 4 extremities. Psychiatric: Normal mood and affect.   Assessment & Plan:    1. Mixed urinary incontinence - There is a stress component to her  incontinence as well as fecal incontinence - She was referred to physical therapy but never went - Initiate Myrbetriq 50 mg pending insurance approval. If not covered, consider alternative OAB medications.  - Reassess in six weeks.  2. Microscopic hematuria - Send urine for culture to rule out infection. .  - If hematuria persists, consider hematuria evaluation  3. Fecal incontinence - Refer to GI specialist for further evaluation of fecal incontinence and possible rectal prolapse.  - Reinstate referral to physical therapy for pelvic floor strengthening.  4. Constipation - Emphasize the importance of hydration to alleviate constipation.  - Consider dietary modifications and possibly stool softeners or laxatives if needed.  Return in about 6 weeks (around 05/09/2023) for repeat UA and reassessment of symptoms.   I have reviewed the above documentation for accuracy and completeness, and I agree with the above.   Vanna Scotland, MD   [facility] [address], [address] [place], [place] [address] [phone]

## 2023-03-31 LAB — CULTURE, URINE COMPREHENSIVE

## 2023-04-18 ENCOUNTER — Other Ambulatory Visit: Payer: Self-pay | Admitting: Internal Medicine

## 2023-04-18 DIAGNOSIS — Z1231 Encounter for screening mammogram for malignant neoplasm of breast: Secondary | ICD-10-CM

## 2023-04-18 DIAGNOSIS — R0789 Other chest pain: Secondary | ICD-10-CM

## 2023-05-09 ENCOUNTER — Ambulatory Visit
Admission: RE | Admit: 2023-05-09 | Discharge: 2023-05-09 | Disposition: A | Discharge status: Home / Self Care | Payer: Medicare PPO | Source: Ambulatory Visit | Attending: Internal Medicine | Admitting: Internal Medicine

## 2023-05-09 ENCOUNTER — Ambulatory Visit: Payer: Medicare PPO | Admitting: Physician Assistant

## 2023-05-09 DIAGNOSIS — R0789 Other chest pain: Secondary | ICD-10-CM | POA: Insufficient documentation

## 2023-05-09 DIAGNOSIS — Z1231 Encounter for screening mammogram for malignant neoplasm of breast: Secondary | ICD-10-CM | POA: Insufficient documentation

## 2024-05-13 ENCOUNTER — Other Ambulatory Visit: Payer: Self-pay | Admitting: Internal Medicine

## 2024-05-13 ENCOUNTER — Other Ambulatory Visit: Payer: Self-pay

## 2024-05-13 DIAGNOSIS — Z1231 Encounter for screening mammogram for malignant neoplasm of breast: Secondary | ICD-10-CM

## 2024-05-13 MED ORDER — MOUNJARO 2.5 MG/0.5ML ~~LOC~~ SOAJ
2.5000 mg | SUBCUTANEOUS | 1 refills | Status: AC
  Filled 2024-05-13 – 2024-06-01 (×3): qty 2, 28d supply, fill #0

## 2024-05-15 ENCOUNTER — Other Ambulatory Visit: Payer: Self-pay

## 2024-05-18 ENCOUNTER — Other Ambulatory Visit: Payer: Self-pay

## 2024-05-18 MED ORDER — METFORMIN HCL 1000 MG PO TABS
500.0000 mg | ORAL_TABLET | Freq: Two times a day (BID) | ORAL | 0 refills | Status: DC
  Filled 2024-05-18: qty 30, 30d supply, fill #0

## 2024-06-01 ENCOUNTER — Other Ambulatory Visit: Payer: Self-pay

## 2024-06-05 ENCOUNTER — Other Ambulatory Visit: Payer: Self-pay

## 2024-06-05 MED ORDER — GLIPIZIDE ER 2.5 MG PO TB24
2.5000 mg | ORAL_TABLET | Freq: Every day | ORAL | 1 refills | Status: AC
  Filled 2024-06-05 (×2): qty 30, 30d supply, fill #0

## 2024-06-05 MED ORDER — METFORMIN HCL 1000 MG PO TABS
500.0000 mg | ORAL_TABLET | Freq: Two times a day (BID) | ORAL | 1 refills | Status: AC
  Filled 2024-06-05: qty 90, 90d supply, fill #0

## 2024-06-20 ENCOUNTER — Other Ambulatory Visit: Payer: Self-pay

## 2024-07-01 ENCOUNTER — Ambulatory Visit
Admission: RE | Admit: 2024-07-01 | Discharge: 2024-07-01 | Disposition: A | Source: Ambulatory Visit | Attending: Internal Medicine | Admitting: Internal Medicine

## 2024-07-01 DIAGNOSIS — Z1231 Encounter for screening mammogram for malignant neoplasm of breast: Secondary | ICD-10-CM
